# Patient Record
Sex: Male | Born: 1960
Health system: Southern US, Community
[De-identification: ages and names within clinical notes are randomized; demographics above are authoritative.]

## PROBLEM LIST (undated history)

## (undated) DIAGNOSIS — R079 Chest pain, unspecified: Secondary | ICD-10-CM

## (undated) DIAGNOSIS — I499 Cardiac arrhythmia, unspecified: Secondary | ICD-10-CM

## (undated) DIAGNOSIS — Z8249 Family history of ischemic heart disease and other diseases of the circulatory system: Secondary | ICD-10-CM

## (undated) DIAGNOSIS — I10 Essential (primary) hypertension: Secondary | ICD-10-CM

## (undated) DIAGNOSIS — R300 Dysuria: Secondary | ICD-10-CM

## (undated) DIAGNOSIS — Z720 Tobacco use: Secondary | ICD-10-CM

## (undated) DIAGNOSIS — I5043 Acute on chronic combined systolic (congestive) and diastolic (congestive) heart failure: Secondary | ICD-10-CM

## (undated) HISTORY — DX: Family history of ischemic heart disease and other diseases of the circulatory system: Z82.49

## (undated) HISTORY — PX: MANDIBLE FRACTURE SURGERY: SHX706

---
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
## 2016-08-23 ENCOUNTER — Emergency Department (HOSPITAL_COMMUNITY)
Admission: EM | Admit: 2016-08-23 | Discharge: 2016-08-23 | Disposition: A | Discharge status: Home / Self Care | Payer: BLUE CROSS/BLUE SHIELD | Attending: Emergency Medicine | Admitting: Emergency Medicine

## 2016-08-23 DIAGNOSIS — I10 Essential (primary) hypertension: Secondary | ICD-10-CM | POA: Insufficient documentation

## 2016-08-23 DIAGNOSIS — K0889 Other specified disorders of teeth and supporting structures: Secondary | ICD-10-CM | POA: Diagnosis present

## 2016-08-23 LAB — CBC WITH DIFFERENTIAL/PLATELET
Basophils Absolute: 0 10*3/uL (ref 0.0–0.1)
Basophils Relative: 0 %
EOS ABS: 0.1 10*3/uL (ref 0.0–0.7)
EOS PCT: 1 %
HCT: 39.5 % (ref 39.0–52.0)
Hemoglobin: 12.5 g/dL — ABNORMAL LOW (ref 13.0–17.0)
Lymphocytes Relative: 30 %
Lymphs Abs: 2.1 10*3/uL (ref 0.7–4.0)
MCH: 24.8 pg — ABNORMAL LOW (ref 26.0–34.0)
MCHC: 31.6 g/dL (ref 30.0–36.0)
MCV: 78.4 fL (ref 78.0–100.0)
MONO ABS: 0.4 10*3/uL (ref 0.1–1.0)
Monocytes Relative: 6 %
Neutro Abs: 4.5 10*3/uL (ref 1.7–7.7)
Neutrophils Relative %: 63 %
PLATELETS: 204 10*3/uL (ref 150–400)
RBC: 5.04 MIL/uL (ref 4.22–5.81)
RDW: 15.9 % — AB (ref 11.5–15.5)
WBC: 7.2 10*3/uL (ref 4.0–10.5)

## 2016-08-23 LAB — BASIC METABOLIC PANEL
Anion gap: 8 (ref 5–15)
BUN: 19 mg/dL (ref 6–20)
CALCIUM: 9.7 mg/dL (ref 8.9–10.3)
CO2: 25 mmol/L (ref 22–32)
CREATININE: 1.04 mg/dL (ref 0.61–1.24)
Chloride: 107 mmol/L (ref 101–111)
GFR calc Af Amer: >60 mL/min (ref >60)
Glucose, Bld: 91 mg/dL (ref 65–99)
Potassium: 3.6 mmol/L (ref 3.5–5.1)
SODIUM: 140 mmol/L (ref 135–145)

## 2016-08-23 MED ORDER — AMLODIPINE BESYLATE 5 MG PO TABS: 5.0000 mg | ORAL_TABLET | Freq: Every day | ORAL | 0 refills | Status: DC

## 2016-08-23 MED ORDER — AMLODIPINE BESYLATE 5 MG PO TABS
5.0000 mg | ORAL_TABLET | Freq: Once | ORAL | Status: AC
  Administered 2016-08-23: 5 mg via ORAL
  Filled 2016-08-23: qty 1

## 2016-08-23 NOTE — ED Provider Notes (Signed)
[facility] DEPT Provider Note   CSN: [id number] Arrival date & time: 08/23/16  4098     History   Chief Complaint Chief Complaint  Patient presents with  . Dental Pain  . Headache    HPI Jason Pitts is a 56 y.o. male.  The history is provided by the patient and medical records. No language interpreter was used.   Jason Pitts is a 56 y.o. male  with a PMH of HTN who presents to the Emergency Department complaining of right sided facial pain / headache which began last night. He reports that he has a loose, broken upper right tooth causing him a great deal of pain. He endorses intermittent right sided facial "spasm like tics". He took tylenol with little relief. He denies visual changes, chest pain, abdominal pain, shortness of breath, neck pain, fevers/chills, difficulty swallowing or facial swelling. He recently moved to the area and does not have PCP or dentist in the area yet.    No past medical history on file.  There are no active problems to display for this patient.   No past surgical history on file.     Home Medications    Prior to Admission medications   Medication Sig Start Date End Date Taking? Authorizing Provider  amLODipine (NORVASC) 5 MG tablet Take 1 tablet (5 mg total) by mouth daily. 08/23/16   Chase Picket Ondra Deboard, PA-C    Family History No family history on file.  Social History Social History  Substance Use Topics  . Smoking status: Not on file  . Smokeless tobacco: Not on file  . Alcohol use Not on file     Allergies   Patient has no allergy information on record.   Review of Systems Review of Systems  Constitutional: Negative for chills and fever.  HENT: Positive for dental problem. Negative for trouble swallowing.   Eyes: Negative for visual disturbance.  Respiratory: Negative for cough and shortness of breath.   Cardiovascular: Negative.   Gastrointestinal: Negative for abdominal pain, nausea and vomiting.  Genitourinary:  Negative for dysuria.  Musculoskeletal: Negative for back pain and neck pain.  Skin: Negative for rash.  Neurological: Positive for headaches. Negative for dizziness and weakness.     Physical Exam Updated Vital Signs BP (!) 176/127 (BP Location: Right Arm)   Pulse 68   Temp 97.7 F (36.5 C) (Oral)   Resp 18   Ht 5\' 3"  (1.6 m)   Wt 78.6 kg   SpO2 99%   BMI 30.69 kg/m   Physical Exam  Constitutional: He is oriented to person, place, and time. He appears well-developed and well-nourished. No distress.  HENT:  Head: Normocephalic and atraumatic.  Mouth/Throat:    Midline uvula, no trismus, oropharynx moist and clear, no oropharyngeal erythema or edema, neck supple and no tenderness. No facial edema  Eyes: EOM are normal. Pupils are equal, round, and reactive to light.  Neck:  No midline or paraspinal tenderness. Full range of motion without pain.  Cardiovascular: Normal rate, regular rhythm and normal heart sounds.   No murmur heard. Pulmonary/Chest: Effort normal and breath sounds normal. No respiratory distress. He has no wheezes. He has no rales. He exhibits no tenderness.  Abdominal: Soft. He exhibits no distension. There is no tenderness.  Musculoskeletal: He exhibits no edema.  Neurological: He is alert and oriented to person, place, and time.  Skin: Skin is warm and dry.  Nursing note and vitals reviewed.    ED Treatments / Results  Labs (all labs ordered are listed, but only abnormal results are displayed) Labs Reviewed  CBC WITH DIFFERENTIAL/PLATELET - Abnormal; Notable for the following:       Result Value   Hemoglobin 12.5 (*)    MCH 24.8 (*)    RDW 15.9 (*)    All other components within normal limits  BASIC METABOLIC PANEL    EKG  EKG Interpretation None       Radiology No results found.  Procedures Procedures (including critical care time)  Medications Ordered in ED Medications  amLODipine (NORVASC) tablet 5 mg (5 mg Oral Given 08/23/16  1054)     Initial Impression / Assessment and Plan / ED Course  I have reviewed the triage vital signs and the nursing notes.  Pertinent labs & imaging results that were available during my care of the patient were reviewed by me and considered in my medical decision making (see chart for details).  Clinical Course    [name] is a 56 y.o. male who presents to ED for dental pain, headache and elevated blood pressure.   1. Dental pain 2/2 loose tooth. Does not appear infected. New to the area and no dentist here but plans on following up and has dentist in mind. Ibuprofen PRN pain.   2. Elevated BP of 172/122. Hx of HTN but has not been on medication in over two years. Labs reassuring. No chest pain, shortness of breath, abdominal pain or visual changes. 5mg  norvasc given. Patient re-evaluated and states that headache is much improved. Will give rx for norvasc. Patient understands this is a one month prescription and he will need to see PCP for follow up. Resources for PCP in the area given.   Return precautions and follow up care discussed again prior to discharge. All questions answered.    Final Clinical Impressions(s) / ED Diagnoses   Final diagnoses:  Pain, dental  Essential hypertension    New Prescriptions New Prescriptions   AMLODIPINE (NORVASC) 5 MG TABLET    Take 1 tablet (5 mg total) by mouth daily.     [facility] Delois Tolbert, PA-C 08/23/16 1240    Marily Memos, MD 08/24/16 [phone]

## 2016-08-23 NOTE — ED Triage Notes (Signed)
Pt. Is having rt. Facial pain into his rt. Head.  Pt. Worked last night and got off work and brought his wife here for chest pain.  He developed rt. Facial pain and head pain yesterday.  He is having spasms into his rt. Face area.  He does have broken rt.upper teeth.   Prt. Is alert and oriented /x4. Skin is warm and dry.  Pt. Does have  htn but took himself off the medications.

## 2016-08-23 NOTE — Discharge Instructions (Signed)
It was my pleasure taking care of you today!  I have given you a one month supply of blood pressure medication. Please take this daily starting in the morning. You will need to follow up with a primary care provider for blood pressure recheck and further refills of your medication.  Please see a dentist for evaluation of dental pain. Ibuprofen as needed for pain.  Return to ER for new or worsening symptoms, any additional concerns.

## 2016-08-23 NOTE — ED Notes (Signed)
Placed patient into a gown and on the monitor patient is resting waiting on provider 

## 2016-08-23 NOTE — ED Notes (Signed)
Pt states he has been  Under considerable stress-- thinks that is why his head hurts.  States "I used to take BP meds"

## 2016-10-17 ENCOUNTER — Ambulatory Visit: Payer: BLUE CROSS/BLUE SHIELD | Admitting: Family Medicine

## 2017-02-15 ENCOUNTER — Encounter: Payer: Self-pay | Admitting: Family Medicine

## 2017-02-15 ENCOUNTER — Ambulatory Visit (INDEPENDENT_AMBULATORY_CARE_PROVIDER_SITE_OTHER): Payer: BLUE CROSS/BLUE SHIELD | Admitting: Family Medicine

## 2017-02-15 VITALS — BP 160/118 | HR 66 | Temp 98.4°F | Ht 63.0 in | Wt 172.6 lb

## 2017-02-15 DIAGNOSIS — Z1159 Encounter for screening for other viral diseases: Secondary | ICD-10-CM

## 2017-02-15 DIAGNOSIS — Z720 Tobacco use: Secondary | ICD-10-CM | POA: Diagnosis not present

## 2017-02-15 DIAGNOSIS — Z8679 Personal history of other diseases of the circulatory system: Secondary | ICD-10-CM | POA: Insufficient documentation

## 2017-02-15 DIAGNOSIS — Z23 Encounter for immunization: Secondary | ICD-10-CM | POA: Diagnosis not present

## 2017-02-15 DIAGNOSIS — I10 Essential (primary) hypertension: Secondary | ICD-10-CM | POA: Diagnosis not present

## 2017-02-15 MED ORDER — NICOTINE 14 MG/24HR TD PT24: 14.0000 mg | MEDICATED_PATCH | Freq: Every day | TRANSDERMAL | 0 refills | Status: DC

## 2017-02-15 MED ORDER — AMLODIPINE BESYLATE 5 MG PO TABS: 10.0000 mg | ORAL_TABLET | Freq: Every day | ORAL | 0 refills | Status: DC

## 2017-02-15 MED ORDER — NICOTINE 7 MG/24HR TD PT24: 14.0000 mg | MEDICATED_PATCH | Freq: Every day | TRANSDERMAL | Status: DC

## 2017-02-15 NOTE — Assessment & Plan Note (Signed)
Patient reports that he has smoked since he was 56 years old, now one pack of cigarettes lasts him 3 days. He initially is pre-contemplative, and then later suggests he would like to try patches. Counseled on the quit line as well. We'll continue to follow with counseling for this. Additionally, his wife smokes.

## 2017-02-15 NOTE — Patient Instructions (Signed)
It was a pleasure to see you today! Thank you for choosing [facility] for your primary care. Jason Pitts was seen for new patient, high blood pressure.   Our plans for today were:  Start the blood pressure medicine that I gave you. If you feel light headed, you can decrease the dose by half.   Try the nicotine patches. It is most helpful to use these if your wife is also trying to quit. You can also use the [place] Quitline for free to get a coach to help you quit. Every quit attempt gets you closer to quitting for good!   Telephone Service is available 24/7 toll-free at  1-800-QUIT-NOW 772-685-8862).   To keep you healthy, we need to monitor some screening tests. You are due for your colonoscopy. Please call to schedule this with the paper we gave you.   You should return to our clinic to see a nurse visit in 1 week for recheck blood pressure. You should see Dr. Chanetta Marshall in 3 months to recheck your blood pressure.   Best,  Dr. Chanetta Marshall

## 2017-02-15 NOTE — Progress Notes (Signed)
   CC: new patient   HPI  Referred by: ED for no PCP, wife sees Dr. 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 No other PMH.   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 Precontemplation. Wants patches. Quit 1 time in the past x 1 week.  Alcohol use: 1 beer this morning, on the weekends, no hx of alcoholism  Drug use: used THC in the past, never IV drugs   CC, SH/smoking status, and VS noted  Objective: BP (!) 160/118   Pulse 66   Temp 98.4 F (36.9 C) (Oral)   Ht 5\' 3"  (1.6 m)   Wt 172 lb 9.6 oz (78.3 kg)   SpO2 97%   BMI 30.57 kg/m  Gen: NAD, alert, cooperative, and pleasant. HEENT: NCAT, EOMI, PERRL CV: RRR, no murmur Resp: CTAB, no wheezes, non-labored Abd: SNTND, BS present, no guarding or organomegaly Ext: No edema, warm Neuro: Alert and oriented, Speech clear, No gross deficits  Assessment and plan:  HTN (hypertension) Chronic. He reports that this runs in his family extensively. History of being on clonidine for this. Does not recall any extensive workup for secondary causes. Started on Norvasc by the ED in January.He reports that he tolerated this well. Will represcribed Norvasc and increase to 10 mg per day. Patient counseled on return precautions, as well as decreasing to 5 mg per day should he feel lightheaded. He reports that he does not check his blood pressure at home, but only he will try to do this. Return to clinic in 1 week for nurse visit to recheck blood pressure, and in 3 months to see me. Will consider workup for secondary causes should this be resistant hypertension.   Tobacco abuse Patient  reports that he has smoked since he was 56 years old, now one pack of cigarettes lasts him 3 days. He initially is pre-contemplative, and then later suggests he would like to try patches. Counseled on the quit line as well. We'll continue to follow with counseling for this. Additionally, his wife smokes.   Orders Placed This Encounter  Procedures  . CBC  . Basic Metabolic Panel  . HIV antibody  . Hepatitis C antibody    Meds ordered this encounter  Medications  . amLODipine (NORVASC) 5 MG tablet    Sig: Take 2 tablets (10 mg total) by mouth daily.    Dispense:  60 tablet    Refill:  0  . DISCONTD: nicotine (NICODERM CQ - dosed in mg/24 hr) patch 14 mg  . nicotine (NICODERM CQ - DOSED IN MG/24 HOURS) 14 mg/24hr patch    Sig: Place 1 patch (14 mg total) onto the skin daily.    Dispense:  28 patch    Refill:  0    Health Maintenance: due for colonoscopy, Given handout for this. Ordered screening HIV and hep C with labs.  Loni Muse, MD, PGY1 02/15/2017 4:45 PM

## 2017-02-15 NOTE — Assessment & Plan Note (Signed)
Chronic. He reports that this runs in his family extensively. History of being on clonidine for this. Does not recall any extensive workup for secondary causes. Started on Norvasc by the ED in January.He reports that he tolerated this well. Will represcribed Norvasc and increase to 10 mg per day. Patient counseled on return precautions, as well as decreasing to 5 mg per day should he feel lightheaded. He reports that he does not check his blood pressure at home, but only he will try to do this. Return to clinic in 1 week for nurse visit to recheck blood pressure, and in 3 months to see me. Will consider workup for secondary causes should this be resistant hypertension.

## 2017-02-18 ENCOUNTER — Other Ambulatory Visit: Payer: Self-pay | Admitting: *Deleted

## 2017-02-18 MED ORDER — AMLODIPINE BESYLATE 5 MG PO TABS: 10.0000 mg | ORAL_TABLET | Freq: Every day | ORAL | 0 refills | Status: DC

## 2017-02-18 NOTE — Addendum Note (Signed)
Addended by: Lamonte Sakai, Jefrey Raburn D on: 02/18/2017 09:54 AM   Modules accepted: Orders, SmartSet

## 2017-02-22 ENCOUNTER — Other Ambulatory Visit: Payer: BLUE CROSS/BLUE SHIELD

## 2017-03-04 ENCOUNTER — Observation Stay (HOSPITAL_BASED_OUTPATIENT_CLINIC_OR_DEPARTMENT_OTHER): Payer: BLUE CROSS/BLUE SHIELD

## 2017-03-04 ENCOUNTER — Emergency Department (HOSPITAL_COMMUNITY): Payer: BLUE CROSS/BLUE SHIELD

## 2017-03-04 ENCOUNTER — Observation Stay (HOSPITAL_COMMUNITY)
Admission: EM | Admit: 2017-03-04 | Discharge: 2017-03-05 | Disposition: A | Discharge status: Home / Self Care | Payer: BLUE CROSS/BLUE SHIELD | Attending: Family Medicine | Admitting: Family Medicine

## 2017-03-04 ENCOUNTER — Encounter (HOSPITAL_COMMUNITY): Payer: Self-pay | Admitting: *Deleted

## 2017-03-04 DIAGNOSIS — I159 Secondary hypertension, unspecified: Secondary | ICD-10-CM

## 2017-03-04 DIAGNOSIS — F17200 Nicotine dependence, unspecified, uncomplicated: Secondary | ICD-10-CM | POA: Insufficient documentation

## 2017-03-04 DIAGNOSIS — Z8249 Family history of ischemic heart disease and other diseases of the circulatory system: Secondary | ICD-10-CM

## 2017-03-04 DIAGNOSIS — Z79899 Other long term (current) drug therapy: Secondary | ICD-10-CM | POA: Insufficient documentation

## 2017-03-04 DIAGNOSIS — Z720 Tobacco use: Secondary | ICD-10-CM | POA: Diagnosis not present

## 2017-03-04 DIAGNOSIS — R079 Chest pain, unspecified: Secondary | ICD-10-CM | POA: Diagnosis not present

## 2017-03-04 DIAGNOSIS — I10 Essential (primary) hypertension: Secondary | ICD-10-CM | POA: Insufficient documentation

## 2017-03-04 DIAGNOSIS — Z8679 Personal history of other diseases of the circulatory system: Secondary | ICD-10-CM | POA: Diagnosis present

## 2017-03-04 DIAGNOSIS — I420 Dilated cardiomyopathy: Secondary | ICD-10-CM | POA: Diagnosis not present

## 2017-03-04 DIAGNOSIS — I428 Other cardiomyopathies: Secondary | ICD-10-CM

## 2017-03-04 HISTORY — DX: Essential (primary) hypertension: I10

## 2017-03-04 HISTORY — DX: Tobacco use: Z72.0

## 2017-03-04 HISTORY — DX: Chest pain, unspecified: R07.9

## 2017-03-04 LAB — BASIC METABOLIC PANEL
ANION GAP: 9 (ref 5–15)
BUN: 24 mg/dL — ABNORMAL HIGH (ref 6–20)
CO2: 22 mmol/L (ref 22–32)
Calcium: 8.9 mg/dL (ref 8.9–10.3)
Chloride: 106 mmol/L (ref 101–111)
Creatinine, Ser: 1.27 mg/dL — ABNORMAL HIGH (ref 0.61–1.24)
GLUCOSE: 108 mg/dL — AB (ref 65–99)
POTASSIUM: 3.6 mmol/L (ref 3.5–5.1)
Sodium: 137 mmol/L (ref 135–145)

## 2017-03-04 LAB — CBC
HEMATOCRIT: 41.1 % (ref 39.0–52.0)
HEMOGLOBIN: 13.2 g/dL (ref 13.0–17.0)
MCH: 24.6 pg — ABNORMAL LOW (ref 26.0–34.0)
MCHC: 32.1 g/dL (ref 30.0–36.0)
MCV: 76.7 fL — ABNORMAL LOW (ref 78.0–100.0)
Platelets: 217 10*3/uL (ref 150–400)
RBC: 5.36 MIL/uL (ref 4.22–5.81)
RDW: 15 % (ref 11.5–15.5)
WBC: 8.4 10*3/uL (ref 4.0–10.5)

## 2017-03-04 LAB — TROPONIN I
TROPONIN I: 0.04 ng/mL — AB (ref <0.03)
TROPONIN I: 0.04 ng/mL — AB (ref <0.03)
TROPONIN I: 0.03 ng/mL — AB (ref <0.03)

## 2017-03-04 LAB — I-STAT TROPONIN, ED: TROPONIN I, POC: 0.03 ng/mL (ref 0.00–0.08)

## 2017-03-04 LAB — LIPID PANEL
Cholesterol: 160 mg/dL (ref 0–200)
HDL: 44 mg/dL (ref >40)
LDL Cholesterol: 99 mg/dL (ref 0–99)
Total CHOL/HDL Ratio: 3.6 RATIO
Triglycerides: 85 mg/dL (ref <150)
VLDL: 17 mg/dL (ref 0–40)

## 2017-03-04 LAB — NM MYOCAR MULTI W/SPECT W/WALL MOTION / EF
CHL CUP RESTING HR STRESS: 58 {beats}/min
CSEPED: 5 min
CSEPEDS: 16 s
CSEPPHR: 91 {beats}/min

## 2017-03-04 MED ORDER — AMLODIPINE BESYLATE 10 MG PO TABS
10.0000 mg | ORAL_TABLET | Freq: Every day | ORAL | Status: DC
  Administered 2017-03-04 – 2017-03-05 (×2): 10 mg via ORAL
  Filled 2017-03-04 (×3): qty 1

## 2017-03-04 MED ORDER — MORPHINE SULFATE (PF) 2 MG/ML IV SOLN: 2.0000 mg | INTRAVENOUS | Status: DC | PRN

## 2017-03-04 MED ORDER — GI COCKTAIL ~~LOC~~: 30.0000 mL | Freq: Four times a day (QID) | ORAL | Status: DC | PRN

## 2017-03-04 MED ORDER — MORPHINE SULFATE (PF) 4 MG/ML IV SOLN
4.0000 mg | Freq: Once | INTRAVENOUS | Status: AC
  Administered 2017-03-04: 4 mg via INTRAVENOUS
  Filled 2017-03-04: qty 1

## 2017-03-04 MED ORDER — HYDRALAZINE HCL 20 MG/ML IJ SOLN
5.0000 mg | Freq: Four times a day (QID) | INTRAMUSCULAR | Status: DC | PRN
  Administered 2017-03-05: 5 mg via INTRAVENOUS
  Filled 2017-03-04: qty 1

## 2017-03-04 MED ORDER — REGADENOSON 0.4 MG/5ML IV SOLN
0.4000 mg | Freq: Once | INTRAVENOUS | Status: AC
  Administered 2017-03-04: 0.4 mg via INTRAVENOUS

## 2017-03-04 MED ORDER — SODIUM CHLORIDE 0.9 % IV SOLN
INTRAVENOUS | Status: DC
  Administered 2017-03-04: 100 mL/h via INTRAVENOUS
  Administered 2017-03-05 (×2): via INTRAVENOUS

## 2017-03-04 MED ORDER — REGADENOSON 0.4 MG/5ML IV SOLN
INTRAVENOUS | Status: AC
  Filled 2017-03-04: qty 5

## 2017-03-04 MED ORDER — NITROGLYCERIN 2 % TD OINT
1.0000 [in_us] | TOPICAL_OINTMENT | Freq: Once | TRANSDERMAL | Status: AC
  Administered 2017-03-04: 1 [in_us] via TOPICAL
  Filled 2017-03-04: qty 1

## 2017-03-04 MED ORDER — ACETAMINOPHEN 325 MG PO TABS
650.0000 mg | ORAL_TABLET | ORAL | Status: DC | PRN
  Administered 2017-03-04 – 2017-03-05 (×4): 650 mg via ORAL
  Filled 2017-03-04 (×4): qty 2

## 2017-03-04 MED ORDER — ONDANSETRON HCL 4 MG/2ML IJ SOLN: 4.0000 mg | Freq: Four times a day (QID) | INTRAMUSCULAR | Status: DC | PRN

## 2017-03-04 MED ORDER — NICOTINE 14 MG/24HR TD PT24
14.0000 mg | MEDICATED_PATCH | Freq: Every day | TRANSDERMAL | Status: DC
  Administered 2017-03-04 – 2017-03-05 (×2): 14 mg via TRANSDERMAL
  Filled 2017-03-04 (×2): qty 1

## 2017-03-04 MED ORDER — ONDANSETRON HCL 4 MG/2ML IJ SOLN
4.0000 mg | Freq: Once | INTRAMUSCULAR | Status: AC
  Administered 2017-03-04: 4 mg via INTRAVENOUS
  Filled 2017-03-04: qty 2

## 2017-03-04 MED ORDER — TECHNETIUM TC 99M TETROFOSMIN IV KIT
10.0000 | PACK | Freq: Once | INTRAVENOUS | Status: AC | PRN
  Administered 2017-03-04: 10 via INTRAVENOUS

## 2017-03-04 MED ORDER — TECHNETIUM TC 99M TETROFOSMIN IV KIT
30.0000 | PACK | Freq: Once | INTRAVENOUS | Status: AC | PRN
  Administered 2017-03-04: 30 via INTRAVENOUS

## 2017-03-04 MED ORDER — ENOXAPARIN SODIUM 40 MG/0.4ML ~~LOC~~ SOLN
40.0000 mg | SUBCUTANEOUS | Status: DC
  Administered 2017-03-04 – 2017-03-05 (×2): 40 mg via SUBCUTANEOUS
  Filled 2017-03-04 (×2): qty 0.4

## 2017-03-04 MED ORDER — ASPIRIN EC 325 MG PO TBEC
325.0000 mg | DELAYED_RELEASE_TABLET | Freq: Every day | ORAL | Status: DC
  Administered 2017-03-04 – 2017-03-05 (×2): 325 mg via ORAL
  Filled 2017-03-04 (×2): qty 1

## 2017-03-04 NOTE — Progress Notes (Signed)
Interim Progress Note  Saw Jason Pitts this morning.  He says he feels well except for a right sided, throbbing headache that he attributes to nitroglycerin.  On exam, he is still very tender to palpation of his left chest wall around T5-T8.  No other abnormalities  Cardiology saw him this morning and are trending his troponins.  If negative, they will proceed with a lexiscan myoview.  Troponins 0.3 --> 0.4 so far.  Lipid panel and A1C ordered.

## 2017-03-04 NOTE — Progress Notes (Signed)
CRITICAL VALUE ALERT  Critical Value: troponin 0.04  Date & Time Notied:  03/04/17 0930  Provider Notified: Lezlie Octave  Orders Received/Actions taken: Dr. Frances Furbish is aware and will continue to monitor.

## 2017-03-04 NOTE — ED Provider Notes (Signed)
[facility] Provider Note   CSN: [id number] Arrival date & time: 03/04/17  0325     History   Chief Complaint Chief Complaint  Patient presents with  . Chest Pain    HPI   Blood pressure (!) 144/107, pulse 65, resp. rate 16, SpO2 95 %.  Jacarri Gesner is a 56 y.o. male with past medical history significant for hypertension and tobacco use complaining of a left-sided chest pain, nonradiating described as sharp onset this evening while he was working, this is nonexertional. It is associated with diaphoresis, shortness of breath and feeling lightheaded. There was no syncope. He states that the pain has eased off somewhat, it is 7 out of 10 right now. He was given full dose aspirin by EMS. He denies any increasing peripheral edema, pulsations, Pain, leg swelling. He has a family history of ACS  Past Medical History:  Diagnosis Date  . HTN (hypertension)     Patient Active Problem List   Diagnosis Date Noted  . HTN (hypertension) 02/15/2017  . Tobacco abuse 02/15/2017    Past Surgical History:  Procedure Laterality Date  . MANDIBLE FRACTURE SURGERY         Home Medications    Prior to Admission medications   Medication Sig Start Date End Date Taking? Authorizing Provider  amLODipine (NORVASC) 5 MG tablet Take 2 tablets (10 mg total) by mouth daily. 02/18/17  Yes Garth Bigness, MD  nicotine (NICODERM CQ - DOSED IN MG/24 HOURS) 14 mg/24hr patch Place 1 patch (14 mg total) onto the skin daily. 02/15/17   Garth Bigness, MD    Family History History reviewed. No pertinent family history.  Social History Social History  Substance Use Topics  . Smoking status: Current Some Day Smoker  . Smokeless tobacco: Never Used  . Alcohol use Yes     Allergies   Patient has no known allergies.   Review of Systems Review of Systems  A complete review of systems was obtained and all systems are negative except as noted in the HPI and PMH.    Physical  Exam Updated Vital Signs BP (!) 144/107   Pulse 65   Temp (!) 97.3 F (36.3 C) (Oral)   Resp 16   SpO2 95%   Physical Exam  Constitutional: He is oriented to person, place, and time. He appears well-developed and well-nourished. No distress.  HENT:  Head: Normocephalic and atraumatic.  Mouth/Throat: Oropharynx is clear and moist.  Eyes: Pupils are equal, round, and reactive to light. Conjunctivae and EOM are normal.  Neck: Normal range of motion. No JVD present. No tracheal deviation present.  Cardiovascular: Normal rate, regular rhythm and intact distal pulses.   Radial pulse equal bilaterally  Pulmonary/Chest: Effort normal and breath sounds normal. No stridor. No respiratory distress. He has no wheezes. He has no rales. He exhibits no tenderness.  Abdominal: Soft. He exhibits no distension and no mass. There is no tenderness. There is no rebound and no guarding.  Musculoskeletal: Normal range of motion. He exhibits no edema or tenderness.  No calf asymmetry, superficial collaterals, palpable cords, edema, Homans sign negative bilaterally.    Neurological: He is alert and oriented to person, place, and time.  Skin: Skin is warm. He is not diaphoretic.  Psychiatric: He has a normal mood and affect.  Nursing note and vitals reviewed.    ED Treatments / Results  Labs (all labs ordered are listed, but only abnormal results are displayed) Labs Reviewed  BASIC METABOLIC PANEL -  Abnormal; Notable for the following:       Result Value   Glucose, Bld 108 (*)    BUN 24 (*)    Creatinine, Ser 1.27 (*)    All other components within normal limits  CBC - Abnormal; Notable for the following:    MCV 76.7 (*)    MCH 24.6 (*)    All other components within normal limits  I-STAT TROPOININ, ED    EKG  EKG Interpretation  Date/Time:  Monday March 04 2017 03:41:35 EDT Ventricular Rate:  67 PR Interval:    QRS Duration: 117 QT Interval:  443 QTC Calculation: 468 R Axis:   41 Text  Interpretation:  Sinus rhythm Borderline prolonged PR interval Probable left atrial enlargement Incomplete right bundle branch block Left ventricular hypertrophy Anterior infarct, old Abnormal T, consider ischemia, lateral leads No old tracing to compare Confirmed by Dione Booze ([id number]) on 03/04/2017 3:50:04 AM       Radiology Dg Chest 2 View  Result Date: 03/04/2017 CLINICAL DATA:  56 year old male with chest pain EXAM: CHEST  2 VIEW COMPARISON:  None. FINDINGS: The lungs are clear. There is no pleural effusion or pneumothorax. There is mild cardiomegaly. No acute osseous pathology. IMPRESSION: No active cardiopulmonary disease. Electronically Signed   By: Elgie Collard M.D.   On: 03/04/2017 04:18    Procedures Procedures (including critical care time)  Medications Ordered in ED Medications  nitroGLYCERIN (NITROGLYN) 2 % ointment 1 inch (not administered)  morphine 4 MG/ML injection 4 mg (4 mg Intravenous Given 03/04/17 0404)  ondansetron (ZOFRAN) injection 4 mg (4 mg Intravenous Given 03/04/17 0404)     Initial Impression / Assessment and Plan / ED Course  I have reviewed the triage vital signs and the nursing notes.  Pertinent labs & imaging results that were available during my care of the patient were reviewed by me and considered in my medical decision making (see chart for details).     Vitals:   03/04/17 0331 03/04/17 0355  BP: (!) 144/107   Pulse: 65   Resp: 16   Temp:  (!) 97.3 F (36.3 C)  TempSrc:  Oral  SpO2: 95%     Medications  nitroGLYCERIN (NITROGLYN) 2 % ointment 1 inch (not administered)  morphine 4 MG/ML injection 4 mg (4 mg Intravenous Given 03/04/17 0404)  ondansetron (ZOFRAN) injection 4 mg (4 mg Intravenous Given 03/04/17 0404)    Lenardo Guntrum is 56 y.o. male presenting with Acute onset of left-sided chest pain, nonradiating associated with nausea, diaphoresis and feeling lightheaded. Pain is severe. He has multiple cardiac risk factors. EKG  concerning for inverted T waves in the inferior leads, there is ST depression in the lateral leads as well as inverted T waves. We do not have a old EKG to compare to. Patient had full dose aspirin, some improvement with morphine, will give nitroglycerin but he will need admission for chest pain rule out. Discussed with family practice Dr. Frances Furbish who accepts admission    Final Clinical Impressions(s) / ED Diagnoses   Final diagnoses:  Chest pain, unspecified type    New Prescriptions New Prescriptions   No medications on file     Kaylyn Lim 03/04/17 0451    Dione Booze, MD 03/04/17 [phone]

## 2017-03-04 NOTE — ED Notes (Signed)
Family Practice at bedside.

## 2017-03-04 NOTE — Progress Notes (Signed)
    Patient presented for Lexiscan nuclear stress test. Tolerated procedure well. Pending final stress imaging result.  Berton Bon, AGNP-C 03/04/2017  1:43 PM Pager: [phone]

## 2017-03-04 NOTE — Consult Note (Signed)
Cardiology Consultation:   Patient ID: Iwao Shamblin; [id number]; Nov 13, 1960   Admit date: 03/04/2017 Date of Consult: 03/04/2017  Primary Care Provider: Garth Bigness, MD Primary Cardiologist: New to [facility] - Dr. Rennis Golden   Patient Profile:   Jason Pitts is a 56 y.o. male with past medical history of HTN and tobacco use who is being seen today for the evaluation of chest pain at the request of Dr. Pollie Meyer.  History of Present Illness:   Mr. Cui presented to [facility] ED on 03/04/2017 for evaluation of chest pain. He reports developing abdominal pain while at work yesterday evening which radiated into his chest. 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 Reports he was very diaphoretic and had some nausea at that time. He initially thought this was due to "gas", so he went to the restroom and passed flatulence which initially helped with his symptoms. He then developed a stabbing pain along his apical region and therefore had his coworkers call EMS.  He reports still having a mild pain at this time, but improved since admission. Pain improved with administration of IV Morphine and NTG patch.   He does report being under increased stress over the past several months as he just recently moved from [place], [place] to [place] and his father is currently in the hospital in [place].  He reports having a heart attack in 1996 but a cardiac catheterization was performed at that time and "normal" according to the patient. Has not been followed by Cardiology since. Does report a history of hypertension but denies any known hyperlipidemia, type 2 diabetes, or kidney issues. Reports his father was diagnosed with CAD in his 49's and has known CHF. The patient does have a 12-pack-year history (having smoked 0.3 ppd for 40 years). He reports occasional alcohol use. Denies any recreational drug use.   Initial labs show WBC of 8.4, Hgb 13.2, platelets 217.  Na+ 137, K+ 3.6, creatinine 1.27 (at 1.04 in 08/2016). Initial troponin negative with repeat values pending. CXR shows no active cardiopulmonary disease. EKG with NSR, HR 67, incomplete RBBB, and LVH with diffuse TWI.     Past Medical History:  Diagnosis Date  . HTN (hypertension)   . Tobacco use     Past Surgical History:  Procedure Laterality Date  . MANDIBLE FRACTURE SURGERY       Inpatient Medications: Scheduled Meds: . amLODipine  10 mg Oral Daily  . aspirin EC  325 mg Oral Daily  . enoxaparin (LOVENOX) injection  40 mg Subcutaneous Q24H  . nicotine  14 mg Transdermal Daily   Continuous Infusions: . sodium chloride 100 mL/hr (03/04/17 0642)   PRN Meds: acetaminophen, gi cocktail, hydrALAZINE, morphine injection, ondansetron (ZOFRAN) IV  Allergies:   No Known Allergies  Social History:   Social History   Social History  . Marital status: Married    Spouse name: N/A  . Number of children: N/A  . Years of education: N/A   Occupational History  . Not on file.   Social History Main Topics  . Smoking status: Current Some Day Smoker    Packs/day: 0.30    Years: 40.00  . Smokeless tobacco: Never Used  . Alcohol use Yes  . Drug use: No  . Sexual activity: Not on file   Other Topics Concern  . Not on file   Social History Narrative  . No narrative on file    Family History:   The patient's  family history includes CAD in his father; Heart failure in his father.  ROS:  Please see the history of present illness.  Review of Systems  Constitution: Positive for weakness. Negative for chills, decreased appetite and fever.  Cardiovascular: Positive for chest pain. Negative for dyspnea on exertion, irregular heartbeat, leg swelling, orthopnea and palpitations.  Respiratory: Negative for shortness of breath.   Musculoskeletal: Positive for arthritis and myalgias.  Gastrointestinal: Positive for bloating and nausea. Negative for hematochezia, melena and vomiting.    Genitourinary: Negative for hematuria.  Neurological: Negative for light-headedness and loss of balance.    All other ROS reviewed and negative.     Physical Exam/Data:   Vitals:   03/04/17 0355 03/04/17 0500 03/04/17 0515 03/04/17 0600  BP:  (!) 144/111 (!) 156/115 (!) 149/99  Pulse:  (!) 58 65 (!) 57  Resp:  13 14   Temp: (!) 97.3 F (36.3 C)   97.6 F (36.4 C)  TempSrc: Oral   Oral  SpO2:  98% 99% 97%  Weight:    171 lb 12.8 oz (77.9 kg)  Height:    5\' 3"  (1.6 m)    Intake/Output Summary (Last 24 hours) at 03/04/17 0719 Last data filed at 03/04/17 0654  Gross per 24 hour  Intake               20 ml  Output                0 ml  Net               20 ml   Filed Weights   03/04/17 0600  Weight: 171 lb 12.8 oz (77.9 kg)   Body mass index is 30.43 kg/m.  General:  Well nourished, well developed African American male appearing in no acute distress. HEENT: normal Lymph: no adenopathy Neck: no JVD Endocrine:  No thryomegaly Vascular: No carotid bruits; FA pulses 2+ bilaterally without bruits  Cardiac:  normal S1, S2; RRR; no murmur. Tender to palpation along apical region. Lungs:  clear to auscultation bilaterally, no wheezing, rhonchi or rales  Abd: soft, nontender, no hepatomegaly  Ext: no edema Musculoskeletal:  No deformities, BUE and BLE strength normal and equal Skin: warm and dry  Neuro:  CNs 2-12 intact, no focal abnormalities noted Psych:  Normal affect   EKG:  The EKG was personally reviewed and demonstrates:  NSR, HR 67, incomplete RBBB, and LVH with diffuse TWI.    Relevant CV Studies:  None on File  Laboratory Data:  Chemistry  Recent Labs Lab 03/04/17 0340  NA 137  K 3.6  CL 106  CO2 22  GLUCOSE 108*  BUN 24*  CREATININE 1.27*  CALCIUM 8.9  GFRNONAA >60  GFRAA >60  ANIONGAP 9    No results for input(s): PROT, ALBUMIN, AST, ALT, ALKPHOS, BILITOT in the last 168 hours. Hematology  Recent Labs Lab 03/04/17 0340  WBC 8.4  RBC  5.36  HGB 13.2  HCT 41.1  MCV 76.7*  MCH 24.6*  MCHC 32.1  RDW 15.0  PLT 217   Cardiac EnzymesNo results for input(s): TROPONINI in the last 168 hours.   Recent Labs Lab 03/04/17 0345  TROPIPOC 0.03    BNPNo results for input(s): BNP, PROBNP in the last 168 hours.  DDimer No results for input(s): DDIMER in the last 168 hours.  Radiology/Studies:  Dg Chest 2 View  Result Date: 03/04/2017 CLINICAL DATA:  56 year old male with chest pain EXAM: CHEST  2  VIEW COMPARISON:  None. FINDINGS: The lungs are clear. There is no pleural effusion or pneumothorax. There is mild cardiomegaly. No acute osseous pathology. IMPRESSION: No active cardiopulmonary disease. Electronically Signed   By: Elgie Collard M.D.   On: 03/04/2017 04:18    Assessment and Plan:   1. Atypical Chest Pain  - presented to [facility] ED on 7/16 for evaluation of abdominal/chest discomfort which started while pushing shopping carts at 2000 on 7/15. Notes associated nausea and diaphoresis. Pain improved with flatulence. Denies any exertional chest pain or dyspnea prior to yesterday's episode.  - He does have cardiac risk factors including HTN, tobacco use, and family history of CAD. However, on examination his pain is reproducible on palpation along the apical region.  - Initial troponin is negative with repeat values pending. EKG shows NSR, HR 67, incomplete RBBB, and LVH with diffuse TWI.  - continue to trend cardiac enzymes. If repeat troponin values remains negative, would favor noninvasive evaluation with a NST in the setting of his atypical symptoms. If enzymes trend upwards, would need definitive evaluation with a cardiac catheterization.   2. HTN - BP at 149/99 this AM.  - continue to follow. Remains on PTA Amlodipine 10mg  daily.   3. Tobacco Use - smokes 0.3 ppd.  - full cessation advised.    Signed, Ellsworth Lennox, PA-C  03/04/2017 7:19 AM

## 2017-03-04 NOTE — H&P (Signed)
Family Medicine Teaching [facility] Admission History and Physical Service Pager: [phone]  Patient name: Uziel Covault Medical record number: [id number] Date of birth: 08-Aug-1961 Age: 56 y.o. Gender: male  Primary Care Provider: Garth Bigness, MD Consultants: none Code Status: full  Chief Complaint: chest pain  Assessment and Plan: Rushi Chasen is a 56 y.o. male presenting with chest pain. PMH is significant for HTN and tobacco abuse.    Chest pain: DDx includes MI, stable angina, GERD, costochondritis, PE.  Chest pain started at around 8:30pm on 03/03/17 and was accompanied by gas pains.  Described as "sharp," non-radiating, patient not sure if it is related to exertion or not.  Also endorses throbbing frontal headache, shortness of breath, nausea without emesis, and diaphoresis.  He took 4 ASA for this, which helped to relieve his symptoms.  Nitroglycerin patch given in ED also helped alleviate his pain, and he is asymptomatic except for chest soreness at this time.  EKG concerning for T wave inversion in lateral leads, incomplete right bundle branch block, left atrial enlargement, no EKG available for comparison.  CXR wnl.  POC Troponin negative x 1.  GI source of pain likely given location and sharp nature of pain; however patient has many risk factors for ACS, so this must be ruled out.  Heart score 4. Denies leg swelling and leg pain, shortness of breath has resolved, so clinical likelihood of PE is low. -admit to telemetry under attending Dr. Pollie Meyer -trend serum troponin -cardiology consulted, appreciate recommendations -daily ASA  -2mg  morphine q2 PRN -repeat EKG at 1000 -GI cocktail PRN -continuous pulse ox -vitals per unit routine  HTN: BP has been 140s-150s/110's since admission, was 160/118 at clinic visit on 02/15/17.  His Norvasc was increased from 5 mg to 10 mg by PCP at that time, is compliant with this and reports that he took it on 03/03/17 am.  -continue  home Norvasc 10 mg daily -hydralazine 5 mg PRN for systolic >180, diastolic > 110  AKI: Creatinine on admission was 1.27, up from his baseline of about 1.0.  JYN:WGNFAOZHYQ is 19, so could be prerenal or possible HTN-induced injury. -NS at 100 mL/hr -follow up repeat BMP   Tobacco abuse: Patient reports 45 years of smoking, about a third of a pack per day, so 15 pack years.  He is trying to quit smoking but has not tried the nicotine patch that were recommended at his clinic visit. -nicotine patch   FEN/GI: NPO, zofran Prophylaxis: lovenox  Disposition: observation for ACS r/o   History of Present Illness:  Konstantin Lehnen is a 56 y.o. male presenting with chest pain.  He has a PMH of HTN and tobacco abuse.  He reports that he felt badly last night at around 8:30pm, but went into work anyway at 10:00pm.  He felt like he had gas pains, but the pains worsened and involved his left midchest after he got to work.  He thinks that the heat and the exertion of pushing carts at work exacerbated his pain.  He felt "queasy" and clammy and went to sit down.  He felt like his blood pressure was up, but he didn't check it.  His chest pain was sharp, located in the T5-T8 area of his left chest, and non-radiating.  He felt short of breath, nauseated, and describes his symptoms as waxing and waning.  He is trying to eat healthier and tried some new organic food last night and he is unsure if that upset his stomach.  He had some soft stools last night. His coworker brought him 4 chewable tabs of 81 mg ASA, and he said that helped his pain.  His coworkers then called EMS, and he was brought to the ED.  He also received a nitroglycerin patch in the ED, which also improved his pain.  He does not like sublingual NG as it gives him headaches.  He now feels pretty comfortable, although he reports some chest soreness.  He cannot tell if his chest pain feels different when he is lying down vs leaning forward.    Review Of  Systems: Per HPI with the following additions:   Review of Systems  Constitutional: Positive for diaphoresis. Negative for chills and fever.  Eyes: Negative for blurred vision and double vision.  Respiratory: Positive for cough and shortness of breath.   Cardiovascular: Positive for chest pain. Negative for leg swelling.  Gastrointestinal: Positive for diarrhea and nausea. Negative for vomiting.  Genitourinary: Negative for dysuria.  Musculoskeletal: Negative for falls and myalgias.  Neurological: Negative for tingling, sensory change and headaches.  Psychiatric/Behavioral: Negative for substance abuse.   Patient Active Problem List   Diagnosis Date Noted  . Chest pain 03/04/2017  . HTN (hypertension) 02/15/2017  . Tobacco abuse 02/15/2017   Past Medical History: Past Medical History:  Diagnosis Date  . HTN (hypertension)    Past Surgical History: Past Surgical History:  Procedure Laterality Date  . MANDIBLE FRACTURE SURGERY     Social History: Social History  Substance Use Topics  . Smoking status: Current Some Day Smoker  . Smokeless tobacco: Never Used  . Alcohol use Yes   Additional social history: third shift worker at [employer], lives with wife. Drinks a beer on Saturdays. Denies drug use.  Please also refer to relevant sections of EMR.  Family History: History reviewed. No pertinent family history. FH of HTN Father has CAD, is 4 years old, currently hospitalized. He has had TIAs, unsure if he has had MI in past  Allergies and Medications: No Known Allergies No current facility-administered medications on file prior to encounter.    Current Outpatient Prescriptions on File Prior to Encounter  Medication Sig Dispense Refill  . amLODipine (NORVASC) 5 MG tablet Take 2 tablets (10 mg total) by mouth daily. 60 tablet 0  . nicotine (NICODERM CQ - DOSED IN MG/24 HOURS) 14 mg/24hr patch Place 1 patch (14 mg total) onto the skin daily. 28 patch 0    Objective: BP (!) 156/115   Pulse 65   Temp (!) 97.3 F (36.3 C) (Oral)   Resp 14   SpO2 99%  Exam: General: comfortable appearing man lying in bed talking on the phone Eyes: EOMI, PERRL ENTM: no lymphadenopathy, slightly dry mucus membranes Neck: supple, nontender Cardiovascular: RRR, no MRG, tender to palpation on the left T5-T8 region Respiratory: CTAB, no increased work of breathing, can talk easily without shortness of breath Gastrointestinal: nontender to palpation, +bowel sounds in all four quadrants MSK: 5/5 strength in all extremities, full ROM Derm: no rashes or bruises visualized Neuro: CN II-XII grossly normal, AAO x 3 Psych: appropriate mood and affect  Labs and Imaging: CBC BMET   Recent Labs Lab 03/04/17 0340  WBC 8.4  HGB 13.2  HCT 41.1  PLT 217    Recent Labs Lab 03/04/17 0340  NA 137  K 3.6  CL 106  CO2 22  BUN 24*  CREATININE 1.27*  GLUCOSE 108*  CALCIUM 8.9     Dg Chest  2 View  Result Date: 03/04/2017 CLINICAL DATA:  56 year old male with chest pain EXAM: CHEST  2 VIEW COMPARISON:  None. FINDINGS: The lungs are clear. There is no pleural effusion or pneumothorax. There is mild cardiomegaly. No acute osseous pathology. IMPRESSION: No active cardiopulmonary disease. Electronically Signed   By: Elgie Collard M.D.   On: 03/04/2017 04:18   EKG at 0350 on 03/04/17:  Sinus rhythm Borderline prolonged PR interval Probable left atrial enlargement Incomplete right bundle branch block Left ventricular hypertrophy Anterior infarct, old Abnormal T, consider ischemia, lateral leads No old tracing to compare   Lennox Solders, MD 03/04/2017, 6:17 AM PGY-1, [facility] FPTS Intern pager: [phone], text pages welcome  FPTS Upper-Level Resident Addendum  I have independently interviewed and examined the patient. I have discussed the above with the original author and agree with their documentation. My edits for  correction/addition/clarification are in pink.Please see also any attending notes.   Dolores Patty, DO PGY-2, [facility] FPTS Service pager: [phone] (text pages welcome through [facility])

## 2017-03-04 NOTE — Progress Notes (Signed)
Pt is back on the floor from having stress test. No distress or pain noted. Will continue to monitor.

## 2017-03-04 NOTE — ED Triage Notes (Signed)
Pt to ED from work by EMS c/o centralized chest pain radiating under L ribcage x1 hour. Pt denies NV or SOB; reports being diaphoretic. Pt took 324mg  ASA pta

## 2017-03-05 ENCOUNTER — Observation Stay (HOSPITAL_BASED_OUTPATIENT_CLINIC_OR_DEPARTMENT_OTHER): Payer: BLUE CROSS/BLUE SHIELD

## 2017-03-05 DIAGNOSIS — R079 Chest pain, unspecified: Secondary | ICD-10-CM | POA: Diagnosis not present

## 2017-03-05 DIAGNOSIS — I10 Essential (primary) hypertension: Secondary | ICD-10-CM

## 2017-03-05 DIAGNOSIS — Z8249 Family history of ischemic heart disease and other diseases of the circulatory system: Secondary | ICD-10-CM | POA: Diagnosis not present

## 2017-03-05 DIAGNOSIS — I420 Dilated cardiomyopathy: Secondary | ICD-10-CM | POA: Diagnosis not present

## 2017-03-05 DIAGNOSIS — I428 Other cardiomyopathies: Secondary | ICD-10-CM

## 2017-03-05 HISTORY — DX: Essential (primary) hypertension: I10

## 2017-03-05 LAB — CBC
HCT: 42 % (ref 39.0–52.0)
Hemoglobin: 13.6 g/dL (ref 13.0–17.0)
MCH: 24.6 pg — ABNORMAL LOW (ref 26.0–34.0)
MCHC: 32.4 g/dL (ref 30.0–36.0)
MCV: 76.1 fL — AB (ref 78.0–100.0)
PLATELETS: 242 10*3/uL (ref 150–400)
RBC: 5.52 MIL/uL (ref 4.22–5.81)
RDW: 14.9 % (ref 11.5–15.5)
WBC: 6.2 10*3/uL (ref 4.0–10.5)

## 2017-03-05 LAB — ECHOCARDIOGRAM COMPLETE
AVLVOTPG: 2 mmHg
Area-P 1/2: 2.62 cm2
CHL CUP MV DEC (S): 285
E decel time: 285 msec
FS: 14 % — AB (ref 28–44)
HEIGHTINCHES: 63 in
IV/PV OW: 0.83
LA diam end sys: 38 mm
LA diam index: 2.11 cm/m2
LA vol A4C: 62.8 ml
LASIZE: 38 mm
LDCA: 4.52 cm2
LVOT SV: 67 mL
LVOT VTI: 14.9 cm
LVOT diameter: 24 mm
LVOTPV: 77 cm/s
Lateral S' vel: 16.5 cm/s
MV pk A vel: 81 m/s
MVPKEVEL: 45.4 m/s
MVSPHT: 84 ms
PW: 17 mm — AB (ref 0.6–1.1)
TAPSE: 16.1 mm
WEIGHTICAEL: 2710.4 [oz_av]

## 2017-03-05 LAB — BASIC METABOLIC PANEL
Anion gap: 6 (ref 5–15)
BUN: 12 mg/dL (ref 6–20)
CALCIUM: 9.1 mg/dL (ref 8.9–10.3)
CHLORIDE: 108 mmol/L (ref 101–111)
CO2: 25 mmol/L (ref 22–32)
Creatinine, Ser: 0.84 mg/dL (ref 0.61–1.24)
GFR calc Af Amer: >60 mL/min (ref >60)
GFR calc non Af Amer: >60 mL/min (ref >60)
Glucose, Bld: 91 mg/dL (ref 65–99)
Potassium: 3.9 mmol/L (ref 3.5–5.1)
SODIUM: 139 mmol/L (ref 135–145)

## 2017-03-05 MED ORDER — CARVEDILOL 3.125 MG PO TABS
3.1250 mg | ORAL_TABLET | Freq: Two times a day (BID) | ORAL | Status: DC
  Administered 2017-03-05 (×2): 3.125 mg via ORAL
  Filled 2017-03-05 (×2): qty 1

## 2017-03-05 MED ORDER — CARVEDILOL 6.25 MG PO TABS: 6.2500 mg | ORAL_TABLET | Freq: Two times a day (BID) | ORAL | Status: DC

## 2017-03-05 MED ORDER — LOSARTAN POTASSIUM 25 MG PO TABS
25.0000 mg | ORAL_TABLET | Freq: Every day | ORAL | Status: DC
  Administered 2017-03-05: 25 mg via ORAL
  Filled 2017-03-05: qty 1

## 2017-03-05 MED ORDER — ISOSORB DINITRATE-HYDRALAZINE 20-37.5 MG PO TABS: 1.0000 | ORAL_TABLET | Freq: Three times a day (TID) | ORAL | 0 refills | Status: DC

## 2017-03-05 MED ORDER — ISOSORB DINITRATE-HYDRALAZINE 20-37.5 MG PO TABS
1.0000 | ORAL_TABLET | Freq: Three times a day (TID) | ORAL | Status: DC
  Administered 2017-03-05 (×2): 1 via ORAL
  Filled 2017-03-05 (×2): qty 1

## 2017-03-05 MED ORDER — CARVEDILOL 3.125 MG PO TABS: 3.1250 mg | ORAL_TABLET | Freq: Two times a day (BID) | ORAL | 0 refills | Status: DC

## 2017-03-05 MED ORDER — LOSARTAN POTASSIUM 25 MG PO TABS: 25.0000 mg | ORAL_TABLET | Freq: Every day | ORAL | 0 refills | Status: DC

## 2017-03-05 MED ORDER — ASPIRIN 325 MG PO TBEC: 325.0000 mg | DELAYED_RELEASE_TABLET | Freq: Every day | ORAL | 0 refills | Status: DC

## 2017-03-05 NOTE — Discharge Summary (Signed)
Family Medicine Teaching [facility] Discharge Summary  Patient name: Jason Pitts Medical record number: [id number] Date of birth: 30-Oct-1960 Age: 56 y.o. Gender: male Date of Admission: 03/04/2017  Date of Discharge: 03/05/17 Admitting Physician: Latrelle Dodrill, MD  Primary Care Provider: Garth Bigness, MD Consultants: Cardiology  Indication for Hospitalization: Chest pain  Discharge Diagnoses/Problem List:  1.  Systolic Heart Failure, EF 30-35% 2.  Hypertension  Disposition: Home  Discharge Condition: stable, improved  Discharge Exam: please refer to progress note from day of discharge  Brief Hospital Course:  Mr. Rusek was admitted on 03/04/17 for ACS rule-out after he experienced left-sided chest pain.  EKG showed T-wave inversions, possible old anterior infarct, LVH, and left atrial enlargement.  Troponins were negative.  Cardiology was consulted and performed a nuclear stress test, which was negative for ischemia but found an EF of 26% and left ventricle dilatation.  An echo the following day revealed an EF of 30-35%.  Cardiology also recommended starting Coreg 3.125 mg BID and Bidil 20-27.5 mg TID as well as ASA 325 mg daily.  We also added losartan 25 mg daily for heart failure and blood pressure treatment, and we discontinued Norvasc 10 mg.  Blood pressure stabilized prior to discharge.  Issues for Follow Up:  1. Heart failure management 2. Blood pressure management  Significant Procedures: nuclear stress test, echo  Significant Labs and Imaging:   Recent Labs Lab 03/04/17 0340 03/05/17 0751  WBC 8.4 6.2  HGB 13.2 13.6  HCT 41.1 42.0  PLT 217 242    Recent Labs Lab 03/04/17 0340 03/05/17 0751  NA 137 139  K 3.6 3.9  CL 106 108  CO2 22 25  GLUCOSE 108* 91  BUN 24* 12  CREATININE 1.27* 0.84  CALCIUM 8.9 9.1     Results/Tests Pending at Time of Discharge: none  Discharge Medications:  Allergies as of 03/05/2017   No Known Allergies     Medication List    STOP taking these medications   amLODipine 5 MG tablet Commonly known as:  NORVASC     TAKE these medications   aspirin 325 MG EC tablet Take 1 tablet (325 mg total) by mouth daily.   carvedilol 3.125 MG tablet Commonly known as:  COREG Take 1 tablet (3.125 mg total) by mouth 2 (two) times daily with a meal.   isosorbide-hydrALAZINE 20-37.5 MG tablet Commonly known as:  BIDIL Take 1 tablet by mouth 3 (three) times daily.   losartan 25 MG tablet Commonly known as:  COZAAR Take 1 tablet (25 mg total) by mouth daily.   nicotine 14 mg/24hr patch Commonly known as:  NICODERM CQ - dosed in mg/24 hours Place 1 patch (14 mg total) onto the skin daily.       Discharge Instructions: Please refer to Patient Instructions section of EMR for full details.  Patient was counseled important signs and symptoms that should prompt return to medical care, changes in medications, dietary instructions, activity restrictions, and follow up appointments.   Follow-Up Appointments: Follow-up Information    Garth Bigness, MD Follow up on 03/15/2017.   Specialty:  Family Medicine Why:  Please go to your hospital follow-up appointment with Dr. Chanetta Marshall at 9:05 on 03/15/17. Contact information: [address] [place] [address] [phone]           Lennox Solders, MD 03/05/2017, 4:04 PM PGY-1, [facility] Family [facility]

## 2017-03-05 NOTE — Progress Notes (Signed)
DAILY PROGRESS NOTE   Patient Name: Jason Pitts Date of Encounter: 03/05/2017  Hospital Problem List   Principal Problem:   Dilated cardiomyopathy [facility]) Active Problems:   HTN (hypertension)   Tobacco abuse   Chest pain   Family history of early CAD   Malignant hypertension    Chief Complaint   No further chest pain  Subjective   Myoview yesterday demonstrated no ischemia - however, the LV is dilated with severe global hypokinesis - LVEF 26%. Echo has not been peformed. Blood pressure remains poorly controlled. He wants to go home  Objective   Vitals:   03/04/17 2024 03/05/17 0418 03/05/17 0545 03/05/17 0626  BP: (!) 167/103 (!) 161/118  (!) 167/122  Pulse: 63 65    Resp: _0 Temp: 98.6 F (37 C) 98.4 F (36.9 C)    TempSrc: Oral Oral    SpO2: 98% 100%  96%  Weight:  169 lb 6.4 oz (76.8 kg)    Height:        Intake/Output Summary (Last 24 hours) at 03/05/17 0856 Last data filed at 03/05/17 6962  Gross per 24 hour  Intake             1680 ml  Output              400 ml  Net             1280 ml   Filed Weights   03/04/17 0600 03/05/17 0418  Weight: 171 lb 12.8 oz (77.9 kg) 169 lb 6.4 oz (76.8 kg)    Physical Exam   General appearance: alert and no distress Neck: no carotid bruit, no JVD and thyroid not enlarged, symmetric, no tenderness/mass/nodules Lungs: clear to auscultation bilaterally Heart: regular rate and rhythm Abdomen: soft, non-tender; bowel sounds normal; no masses,  no organomegaly Extremities: extremities normal, atraumatic, no cyanosis or edema Pulses: 2+ and symmetric Skin: Skin color, texture, turgor normal. No rashes or lesions Neurologic: Grossly normal Psych: Pleasant  Inpatient Medications    Scheduled Meds: . amLODipine  10 mg Oral Daily  . aspirin EC  325 mg Oral Daily  . enoxaparin (LOVENOX) injection  40 mg Subcutaneous Q24H  . isosorbide-hydrALAZINE  1 tablet Oral TID  . nicotine  14 mg Transdermal Daily     Continuous Infusions: . sodium chloride 100 mL/hr at 03/05/17 0615    PRN Meds: acetaminophen, gi cocktail, hydrALAZINE, morphine injection, ondansetron (ZOFRAN) IV    Labs   Results for orders placed or performed during the hospital encounter of 03/04/17 (from the past 48 hour(s))  Basic metabolic panel     Status: Abnormal   Collection Time: 03/04/17  3:40 AM  Result Value Ref Range   Sodium 137 135 - 145 mmol/L   Potassium 3.6 3.5 - 5.1 mmol/L   Chloride 106 101 - 111 mmol/L   CO2 22 22 - 32 mmol/L   Glucose, Bld 108 (H) 65 - 99 mg/dL   BUN 24 (H) 6 - 20 mg/dL   Creatinine, Ser 1.27 (H) 0.61 - 1.24 mg/dL   Calcium 8.9 8.9 - 10.3 mg/dL   GFR calc non Af Amer >60 >60 mL/min   GFR calc Af Amer >60 >60 mL/min    Comment: (NOTE) The eGFR has been calculated using the CKD EPI equation. This calculation has not been validated in all clinical situations. eGFR's persistently <60 mL/min signify possible Chronic Kidney Disease.    Anion gap 9 5 -  15  CBC     Status: Abnormal   Collection Time: 03/04/17  3:40 AM  Result Value Ref Range   WBC 8.4 4.0 - 10.5 K/uL   RBC 5.36 4.22 - 5.81 MIL/uL   Hemoglobin 13.2 13.0 - 17.0 g/dL   HCT 41.1 39.0 - 52.0 %   MCV 76.7 (L) 78.0 - 100.0 fL   MCH 24.6 (L) 26.0 - 34.0 pg   MCHC 32.1 30.0 - 36.0 g/dL   RDW 15.0 11.5 - 15.5 %   Platelets 217 150 - 400 K/uL  I-stat troponin, ED     Status: None   Collection Time: 03/04/17  3:45 AM  Result Value Ref Range   Troponin i, poc 0.03 0.00 - 0.08 ng/mL   Comment 3            Comment: Due to the release kinetics of cTnI, a negative result within the first hours of the onset of symptoms does not rule out myocardial infarction with certainty. If myocardial infarction is still suspected, repeat the test at appropriate intervals.   Troponin I-serum (0, 3, 6 hours)     Status: Abnormal   Collection Time: 03/04/17  6:33 AM  Result Value Ref Range   Troponin I 0.04 (HH) <0.03 ng/mL     Comment: REPEATED TO VERIFY CRITICAL RESULT CALLED TO, READ BACK BY AND VERIFIED WITH: MUELLER,M RN @ [phone] 03/04/17 LEONARD,A   Troponin I-serum (0, 3, 6 hours)     Status: Abnormal   Collection Time: 03/04/17  8:53 AM  Result Value Ref Range   Troponin I 0.04 (HH) <0.03 ng/mL    Comment: CRITICAL VALUE NOTED.  VALUE IS CONSISTENT WITH PREVIOUSLY REPORTED AND CALLED VALUE.  Lipid panel     Status: None   Collection Time: 03/04/17  8:53 AM  Result Value Ref Range   Cholesterol 160 0 - 200 mg/dL   Triglycerides 85 <150 mg/dL   HDL 44 >40 mg/dL   Total CHOL/HDL Ratio 3.6 RATIO   VLDL 17 0 - 40 mg/dL   LDL Cholesterol 99 0 - 99 mg/dL    Comment:        Total Cholesterol/HDL:CHD Risk Coronary Heart Disease Risk Table                     Men   Women  1/2 Average Risk   3.4   3.3  Average Risk       5.0   4.4  2 X Average Risk   9.6   7.1  3 X Average Risk  23.4   11.0        Use the calculated Patient Ratio above and the CHD Risk Table to determine the patient's CHD Risk.        ATP III CLASSIFICATION (LDL):  <100     mg/dL   Optimal  100-129  mg/dL   Near or Above                    Optimal  130-159  mg/dL   Borderline  160-189  mg/dL   High  >190     mg/dL   Very High   Troponin I-serum (0, 3, 6 hours)     Status: Abnormal   Collection Time: 03/04/17  4:00 PM  Result Value Ref Range   Troponin I 0.03 (HH) <0.03 ng/mL    Comment: CRITICAL VALUE NOTED.  VALUE IS CONSISTENT WITH PREVIOUSLY REPORTED AND CALLED VALUE.  ECG   N/A- Personally Reviewed  Telemetry   Sinus rhythm - Personally Reviewed  Radiology    Dg Chest 2 View  Result Date: 03/04/2017 CLINICAL DATA:  56 year old male with chest pain EXAM: CHEST  2 VIEW COMPARISON:  None. FINDINGS: The lungs are clear. There is no pleural effusion or pneumothorax. There is mild cardiomegaly. No acute osseous pathology. IMPRESSION: No active cardiopulmonary disease. Electronically Signed   By: Anner Crete M.D.    On: 03/04/2017 04:18   Nm Myocar Multi W/spect W/wall Motion / Ef  Result Date: 03/04/2017  There was no ST segment deviation noted during stress.  Nuclear stress EF: 26%. The left ventricular ejection fraction is severely decreased (<30%).  There is no ischemia.  The LV is severely dilated. There is global hypokinesis.  This is a high risk study based on the reduced LV function.     Cardiac Studies    There was no ST segment deviation noted during stress.  Nuclear stress EF: 26%. The left ventricular ejection fraction is severely decreased (<30%).  There is no ischemia.  The LV is severely dilated. There is global hypokinesis.  This is a high risk study based on the reduced LV function.  Assessment   Principal Problem:   Dilated cardiomyopathy ([place]) Active Problems:   HTN (hypertension)   Tobacco abuse   Chest pain   Family history of early CAD   Malignant hypertension   Plan   1. Mr. Ake had a non-ischemic myoview, however, was noted to have a dilated cardiomyopathy with EF 26%. Will get an echo to confirm this. May be due to malignant hypertension. Will add bidil and carvedilol for additional BP control today. Does not appear volume overloaded on exam. Would not d/c until we can confirm LV function and BP is adequately controlled.  Time Spent Directly with Patient:  I have spent a total of 15 minutes with the patient reviewing hospital notes, telemetry, EKGs, labs and examining the patient as well as establishing an assessment and plan that was discussed personally with the patient. > 50% of time was spent in direct patient care.  Length of Stay:  LOS: 0 days   Pixie Casino, MD, [place]  Attending Cardiologist  Direct Dial: [phone]  Fax: [phone]  Website:  [email or web address] 03/05/2017, 8:56 AM

## 2017-03-05 NOTE — Progress Notes (Signed)
Family Medicine Teaching Service Daily Progress Note Intern Pager: [phone]  Patient name: Jason Pitts Medical record number: [id number] Date of birth: [birth year] Age: 56 y.o. Gender: male  Primary Care Provider: Garth Bigness, MD Consultants: Cardiology Code Status: FULL  Pt Overview and Major Events to Date:  Jason Pitts was admitted on 03/04/17 for chest pain.  His troponins were measured and followed, and they were 0.03, 0.04, and 0.04.  An EKG in the ED was concerning for T wave inversions, right bundle branch block, left atrial enlargement, and LVH.  His chest pain had mostly resolved after arrival, but due to his risk factors and EKG, cardiology was consulted.  They performed a nuclear stress test on 03/04/17, which showed no ischemia but an EF of 26% and left ventricle dilation.  Assessment and Plan:  Jason Pitts is a 56 y.o. male presenting with chest pain. PMH is significant for HTN and tobacco abuse.    Chest pain: DDx includes MI, stable angina, GERD, costochondritis, PE.  Chest pain started at around 8:30pm on 03/03/17 and was accompanied by gas pains.  Described as "sharp," non-radiating, patient not sure if it is related to exertion or not.  Also endorses throbbing frontal headache, shortness of breath, nausea without emesis, and diaphoresis.  He took 4 ASA for this, which helped to relieve his symptoms.  Nitroglycerin patch given in ED also helped alleviate his pain, and he is asymptomatic except for chest soreness at this time.  EKG concerning for T wave inversion in lateral leads, incomplete right bundle branch block, left atrial enlargement, no EKG available for comparison.  CXR wnl.  POC Troponin negative x 1.  GI source of pain likely given location and sharp nature of pain; however patient has many risk factors for ACS, so this must be ruled out.  Heart score 4. Denies leg swelling and leg pain, shortness of breath has resolved, so clinical likelihood of PE is low.   Lipid panel from 03/04/17 was wnl. -cardiology consulted, appreciate recommendations -daily ASA  -start Coreg 3.125 mg BID -2mg  morphine q2 PRN -echo 03/05/17 -GI cocktail PRN -continuous pulse ox -vitals per unit routine -f/u on A1C result -Echo  HTN: BP has been 140s-150s/110's since admission, was 160/118 at clinic visit on 02/15/17.  His Norvasc was increased from 5 mg to 10 mg by PCP at that time, is compliant with this and reports that he took it on 03/03/17 am.  Hydralazine given for elevated BP at 0400 did not lower BP, so morning dose of 10 mg Norvasc given earlier than prescription time. -discontinue Norvasc and add Losartan 25 mg daily -hydralazine 5 mg PRN for systolic >180, diastolic > 110 -Coreg 3.125 mg BID, Bidil 20-37.5 mg TID added for better control  AKI: Creatinine on admission was 1.27, up from his baseline of about 1.0.  Jason Pitts is 18, so could be prerenal or possible HTN-induced injury.  Creatinine 0.84 on 03/05/17 -NS at 100 mL/hr -follow up repeat BMP   Tobacco abuse: Patient reports 45 years of smoking, about a third of a pack per day, so 15 pack years.  He is trying to quit smoking but has not tried the nicotine patch that were recommended at his clinic visit. -nicotine patch   FEN/GI: NPO, zofran Prophylaxis: lovenox  Disposition: home  Subjective:  Jason Pitts continues to feel improved and denies any chest pain except for some lingering soreness in the T5-T8 region of his left chest.  He received his nuclear stress test  results yesterday and understands that his heart isn't pumping as well as it should.  He is ready to go home soon.  Objective: Temp:  [98.4 F (36.9 C)-98.6 F (37 C)] 98.4 F (36.9 C) (07/17 0418) Pulse Rate:  [52-82] 65 (07/17 0418) Resp:  [12-18] 13 (07/17 0626) BP: (138-167)/(91-122) 167/122 (07/17 0626) SpO2:  [96 %-100 %] 96 % (07/17 0626) Weight:  [169 lb 6.4 oz (76.8 kg)] 169 lb 6.4 oz (76.8 kg) (07/17 0418) Physical  Exam: General: pleasant, well-nourished male talking on the phone when I arrived, in NAD Cardiovascular: RRR, no MRG Respiratory: CTAB Abdomen: soft, nontender to palpation, +bowel sounds Extremities: moves all extremities spontaneously  Laboratory:  Recent Labs Lab 03/04/17 0340 03/05/17 0751  WBC 8.4 6.2  HGB 13.2 13.6  HCT 41.1 42.0  PLT 217 242    Recent Labs Lab 03/04/17 0340 03/05/17 0751  NA 137 139  K 3.6 3.9  CL 106 108  CO2 22 25  BUN 24* 12  CREATININE 1.27* 0.84  CALCIUM 8.9 9.1  GLUCOSE 108* 91     Imaging/Diagnostic Tests: Dg Chest 2 View  Result Date: 03/04/2017 CLINICAL DATA:  56 year old male with chest pain EXAM: CHEST  2 VIEW COMPARISON:  None. FINDINGS: The lungs are clear. There is no pleural effusion or pneumothorax. There is mild cardiomegaly. No acute osseous pathology. IMPRESSION: No active cardiopulmonary disease. Electronically Signed   By: Elgie Collard M.D.   On: 03/04/2017 04:18   Nm Myocar Multi W/spect W/wall Motion / Ef  Result Date: 03/04/2017  There was no ST segment deviation noted during stress.  Nuclear stress EF: 26%. The left ventricular ejection fraction is severely decreased (<30%).  There is no ischemia.  The LV is severely dilated. There is global hypokinesis.  This is a high risk study based on the reduced LV function.      Lennox Solders, MD 03/05/2017, 7:24 AM PGY-1, [facility] Family Medicine FPTS Intern pager: [phone], text pages welcome

## 2017-03-05 NOTE — Progress Notes (Signed)
  Echocardiogram 2D Echocardiogram has been performed.  Jason Pitts 03/05/2017, 11:05 AM

## 2017-03-05 NOTE — Discharge Instructions (Signed)
Please stop taking Norvasc for your blood pressure, and start taking Losartan once a day, Coreg twice per day, and BiDil three times per day.  Please also take aspirin 325 mg once per day.

## 2017-03-05 NOTE — Plan of Care (Signed)
Problem: Safety: Goal: Ability to remain free from injury will improve Outcome: Progressing Fall risk bundle in place. No skin break down, falls or other injuries this shift. Will continue to monitor and assess this shift.   Problem: Pain Managment: Goal: General experience of comfort will improve Outcome: Progressing No complaints of pain this shift.

## 2017-03-05 NOTE — Progress Notes (Signed)
Pt was upset that he was not told that he was being discharged tonight. Pt requested that Dr. Natale Milch speak with him regarding discharge and medications. Dr. Natale Milch was paged and came to speak with pt. Pt stated he had all of his questions answered and felt comfortable with being discharge.

## 2017-03-06 ENCOUNTER — Telehealth: Payer: Self-pay | Admitting: Family Medicine

## 2017-03-06 LAB — HEMOGLOBIN A1C
HEMOGLOBIN A1C: 6.1 % — AB (ref 4.8–5.6)
Mean Plasma Glucose: 128 mg/dL

## 2017-03-06 NOTE — Telephone Encounter (Signed)
Late Entry PCP note:   Called patient's room x 2 on 7/16 to check on him (did not see in person as there was concern for shingles). No answer. Appreciate the excellent care of the inpatient team. Will look forward to seeing him on 7/27.   Loni Muse, MD

## 2017-03-15 ENCOUNTER — Ambulatory Visit (INDEPENDENT_AMBULATORY_CARE_PROVIDER_SITE_OTHER): Payer: BLUE CROSS/BLUE SHIELD | Admitting: Family Medicine

## 2017-03-15 ENCOUNTER — Encounter: Payer: Self-pay | Admitting: Family Medicine

## 2017-03-15 DIAGNOSIS — I10 Essential (primary) hypertension: Secondary | ICD-10-CM | POA: Diagnosis not present

## 2017-03-15 DIAGNOSIS — I420 Dilated cardiomyopathy: Secondary | ICD-10-CM | POA: Diagnosis not present

## 2017-03-15 DIAGNOSIS — Z720 Tobacco use: Secondary | ICD-10-CM | POA: Diagnosis not present

## 2017-03-15 MED ORDER — LOSARTAN POTASSIUM 25 MG PO TABS: 50.0000 mg | ORAL_TABLET | Freq: Every day | ORAL | 0 refills | Status: DC

## 2017-03-15 NOTE — Patient Instructions (Signed)
It was a pleasure to see you today! Thank you for choosing [facility] for your primary care. Jason Pitts was seen for blood pressure.   Our plans for today were:  Increase your losartan to 50mg   Google Bidil patient assistance and fill out that form to try to get the cost down.   To keep you healthy, we need to monitor some screening tests. You are due for colonoscopy. Please schedule these.   You should return to our clinic to see Dr. Chanetta Marshall in 2 months for HTN.   Best,  Dr. Chanetta Marshall

## 2017-03-15 NOTE — Assessment & Plan Note (Signed)
Poorly controlled. Did not get Bidil due to cost. Prioritizing losartan due to CHF. Increase losartan to 50mg  QD and RTC for lab appt next week to recheck BMP. Patient given instructions to fill out patient assistance program online for bidil which should bring cost to $25 per month.

## 2017-03-15 NOTE — Assessment & Plan Note (Signed)
Currently smoking. Wants to quit with patches but needs to prioritize medication costs with his income. He will work on getting patches next month.

## 2017-03-15 NOTE — Progress Notes (Signed)
   CC: hospital follow up   HPI Hosp follow up: hx of resistant HTN, admitted for ACS workup, found to have new onset CHF. Cardiology following Dr. Rennis Golden. Started on bidil, losartan, coreg.   Since discharge, has been taking ASA, losartan, coreg. Bidil was $300, so he did not pick this up. Chest pain no. Taking it easy activity wise. BP checks at home - can't recall numbers. "Good for him." Wife is helping manage meds. Taking MVI.   CC, SH/smoking status, and VS noted  Objective: BP (!) 160/108   Pulse 61   Temp 98 F (36.7 C) (Oral)   Ht 5\' 3"  (1.6 m)   Wt 173 lb 12.8 oz (78.8 kg)   SpO2 94%   BMI 30.79 kg/m  Gen: NAD, alert, cooperative, and pleasant. HEENT: NCAT, EOMI, PERRL CV: RRR, no murmur Resp: CTAB, no wheezes, non-labored Abd: SNTND, BS present, no guarding or organomegaly Ext: No edema, warm Neuro: Alert and oriented, Speech clear, No gross deficits  Assessment and plan:  HTN (hypertension) Poorly controlled. Did not get Bidil due to cost. Prioritizing losartan due to CHF. Increase losartan to 50mg  QD and RTC for lab appt next week to recheck BMP. Patient given instructions to fill out patient assistance program online for bidil which should bring cost to $25 per month.   Dilated cardiomyopathy (HCC) Cards appt made for 8/16. Patient unable to afford bidil, gave instructions for discount program. Increased losartan. No signs of volume overload on exam.   Tobacco abuse Currently smoking. Wants to quit with patches but needs to prioritize medication costs with his income. He will work on getting patches next month.    Orders Placed This Encounter  Procedures  . Basic metabolic panel    Standing Status:   Future    Standing Expiration Date:   03/15/2018    Meds ordered this encounter  Medications  . losartan (COZAAR) 25 MG tablet    Sig: Take 2 tablets (50 mg total) by mouth daily.    Dispense:  120 tablet    Refill:  0    Health Maintenance reviewed -  patient asked to schedule his colonoscopy.  Loni Muse, MD, PGY2 03/15/2017 12:23 PM

## 2017-03-15 NOTE — Assessment & Plan Note (Signed)
Cards appt made for 8/16. Patient unable to afford bidil, gave instructions for discount program. Increased losartan. No signs of volume overload on exam.

## 2017-03-18 ENCOUNTER — Other Ambulatory Visit: Payer: BLUE CROSS/BLUE SHIELD

## 2017-03-18 DIAGNOSIS — Z1159 Encounter for screening for other viral diseases: Secondary | ICD-10-CM

## 2017-03-18 DIAGNOSIS — I10 Essential (primary) hypertension: Secondary | ICD-10-CM

## 2017-03-19 ENCOUNTER — Telehealth: Payer: Self-pay | Admitting: Family Medicine

## 2017-03-19 LAB — CBC
HEMOGLOBIN: 13 g/dL (ref 13.0–17.7)
Hematocrit: 41.4 % (ref 37.5–51.0)
MCH: 24.3 pg — ABNORMAL LOW (ref 26.6–33.0)
MCHC: 31.4 g/dL — ABNORMAL LOW (ref 31.5–35.7)
MCV: 77 fL — ABNORMAL LOW (ref 79–97)
Platelets: 265 10*3/uL (ref 150–379)
RBC: 5.35 x10E6/uL (ref 4.14–5.80)
RDW: 16.3 % — ABNORMAL HIGH (ref 12.3–15.4)
WBC: 6.5 10*3/uL (ref 3.4–10.8)

## 2017-03-19 LAB — HIV ANTIBODY (ROUTINE TESTING W REFLEX): HIV SCREEN 4TH GENERATION: NONREACTIVE

## 2017-03-19 LAB — BASIC METABOLIC PANEL
BUN/Creatinine Ratio: 13 (ref 9–20)
BUN: 14 mg/dL (ref 6–24)
CALCIUM: 9.3 mg/dL (ref 8.7–10.2)
CHLORIDE: 101 mmol/L (ref 96–106)
CO2: 24 mmol/L (ref 20–29)
Creatinine, Ser: 1.09 mg/dL (ref 0.76–1.27)
GFR calc Af Amer: 87 mL/min/{1.73_m2} (ref >59)
GFR, EST NON AFRICAN AMERICAN: 75 mL/min/{1.73_m2} (ref >59)
Glucose: 102 mg/dL — ABNORMAL HIGH (ref 65–99)
POTASSIUM: 4.2 mmol/L (ref 3.5–5.2)
Sodium: 142 mmol/L (ref 134–144)

## 2017-03-19 LAB — HEPATITIS C ANTIBODY: Hep C Virus Ab: <0.1 s/co ratio (ref 0.0–0.9)

## 2017-03-19 NOTE — Telephone Encounter (Signed)
Would like to talk to dr Chanetta Marshall about his papers from [place] about returning to work

## 2017-03-20 NOTE — Telephone Encounter (Signed)
Pt came by to see if dr. Chanetta Marshall had received papers from [facility] and I told him that she said she has not received them.  Pt will have them faxed as well as drop a copy off tomorrow he said. Lamonte Sakai, Zaine Elsass D, [name]

## 2017-03-20 NOTE — Telephone Encounter (Signed)
Left a message for patient to call back at his convenience.

## 2017-03-22 ENCOUNTER — Telehealth: Payer: Self-pay | Admitting: Family Medicine

## 2017-03-22 NOTE — Telephone Encounter (Signed)
Form completed, given to RN.

## 2017-03-22 NOTE — Telephone Encounter (Signed)
Tried calling patient to inform him that FMLA forms were faxed, copied for scanning in patient's chart and original copy placed up front for pickup.  Clovis Pu, RN

## 2017-03-22 NOTE — Telephone Encounter (Signed)
FMLA form dropped off for at front desk for completion.  Verified that patient section of form has been completed.  New patient appt with PCP was 02/15/17.  Placed form in team folder to be completed by clinical staff.  Chari Manning

## 2017-03-22 NOTE — Telephone Encounter (Signed)
Clinical info completed on FMLA form.  Place form in Dr. Christena Flake box for completion.  Sunday Spillers, CMA

## 2017-03-25 ENCOUNTER — Other Ambulatory Visit: Payer: Self-pay | Admitting: Family Medicine

## 2017-03-27 NOTE — Telephone Encounter (Signed)
Will not refill. This was discontinued at most recent hospitalization.

## 2017-03-28 ENCOUNTER — Other Ambulatory Visit: Payer: Self-pay | Admitting: Family Medicine

## 2017-03-28 NOTE — Telephone Encounter (Signed)
Called to inform pt that the Rx will be at the requested pharmacy and to let him know for future reference that the [facility] pharmacies are connected and that he can just call the pharmacy he wants to have it filled at and they should be able to see the Rx in their system in case it is sent to a different [facility]. Lamonte Sakai, Eivan Gallina D, [name]

## 2017-03-28 NOTE — Telephone Encounter (Signed)
Please resend losartan to the [facility] on [address]

## 2017-03-28 NOTE — Telephone Encounter (Signed)
We do not need to duplicate this prescription to transfer from [facility] to [place] (it was sent to [facility] on [address]). He can call the [facility] on [address] to have them pull up the losartan in their system - it is all connected.

## 2017-04-08 ENCOUNTER — Telehealth: Payer: Self-pay | Admitting: Family Medicine

## 2017-04-08 NOTE — Telephone Encounter (Signed)
Please call patient regarding paperwork dropped off as [employer] is saying that one day of leave is not long enough. Patient would like for you to call [employer] (number on paperwork),     Worker comp form dropped off again at front desk for completion.  Verified that patient section of form has been completed.  New patient visit with PCP was 02/15/17 .  Placed form in team folder to be completed by clinical staff.  Chari Manning

## 2017-04-10 NOTE — Telephone Encounter (Signed)
Clinical info completed on FMLA form.  Place form in Dr. Timberlake's box for completion.  Zimmerman Rumple, Juli Odom D, CMA  

## 2017-04-11 NOTE — Telephone Encounter (Signed)
Completed FMLA paperwork once again. Patient previously told me that the paperwork was to cover his admission in July. I have adapted the paperwork to attempt to explain that he could be hospitalized in the future for additional heart failure exacerbations. In my medical opinion, there is no reason he can't go to work right now. I have filled out the forms to the best of my ability. Forms given to RN box.

## 2017-04-12 NOTE — Telephone Encounter (Signed)
Patient informed that disability forms are completed and faxed to [facility] [phone]. Forms copied for scanning in patient's record.  Original copy placed up front for pickup.  Clovis Pu, RN

## 2017-04-18 ENCOUNTER — Ambulatory Visit (INDEPENDENT_AMBULATORY_CARE_PROVIDER_SITE_OTHER): Payer: BLUE CROSS/BLUE SHIELD | Admitting: Internal Medicine

## 2017-04-18 ENCOUNTER — Encounter: Payer: Self-pay | Admitting: Internal Medicine

## 2017-04-18 VITALS — BP 180/124 | HR 63 | Ht 63.0 in | Wt 172.8 lb

## 2017-04-18 DIAGNOSIS — I5043 Acute on chronic combined systolic (congestive) and diastolic (congestive) heart failure: Secondary | ICD-10-CM | POA: Diagnosis not present

## 2017-04-18 DIAGNOSIS — Z79899 Other long term (current) drug therapy: Secondary | ICD-10-CM

## 2017-04-18 DIAGNOSIS — Z720 Tobacco use: Secondary | ICD-10-CM

## 2017-04-18 DIAGNOSIS — I10 Essential (primary) hypertension: Secondary | ICD-10-CM

## 2017-04-18 DIAGNOSIS — I420 Dilated cardiomyopathy: Secondary | ICD-10-CM | POA: Diagnosis not present

## 2017-04-18 DIAGNOSIS — Z01810 Encounter for preprocedural cardiovascular examination: Secondary | ICD-10-CM

## 2017-04-18 MED ORDER — HYDRALAZINE HCL 25 MG PO TABS: 25.0000 mg | ORAL_TABLET | Freq: Three times a day (TID) | ORAL | 5 refills | Status: DC

## 2017-04-18 MED ORDER — SACUBITRIL-VALSARTAN 24-26 MG PO TABS: 1.0000 | ORAL_TABLET | Freq: Two times a day (BID) | ORAL | 5 refills | Status: DC

## 2017-04-18 NOTE — Patient Instructions (Addendum)
Your physician has recommended you make the following change in your medication:  -- START hydralazine 25mg  twice daily -- START entresto 24/26mg  twice daily -- STOP losartan  Your physician recommends that you return for lab work in: ONE WEEK (BMET)  Dr. Rennis Golden has requested that you schedule an appointment with one of our clinical pharmacists for a blood pressure check appointment within the next 3-4 weeks.  -- if you monitor your blood pressure (BP) at home, please bring your BP cuff and your BP readings with you to this appointment -- please check your BP no more than twice daily, after you have been sitting/resting for 5-10 minutes, at least 1 hour after taking your BP medications  Your physician recommends that you schedule a follow-up appointment in: TWO MONTHS with Dr. Rennis Golden. Jason Pitts

## 2017-04-20 DIAGNOSIS — Z01810 Encounter for preprocedural cardiovascular examination: Secondary | ICD-10-CM | POA: Insufficient documentation

## 2017-04-20 NOTE — Progress Notes (Signed)
OFFICE FOLLOW-UP NOTE  Chief Complaint:  Follow-up hospitalization, preoperative clearance  Primary Care Physician: Garth Bigness, MD  HPI:  Jason Pitts is a 56 y.o. male with a past medial history significant for chest pain, tobacco abuse, malignant hypertension, family history of premature coronary artery disease, and recent admission for chest pain. He ruled out for MI and underwent a nuclear stress test which was negative for ischemia however showed severely dilated LV with global hypokinesis and EF of 26%. Echo on 03/05/2017 showed an EF of 30-35% with global hypokinesis and a dilated aortic root of 4.3 cm. He was placed on appropriate medications including BiDil, but reported that was too expensive for him and did not give the medicine filled. Today he returns for follow-up. Blood pressure initially was elevated at 180/124. He denies headache, blurred vision, decreased urine output or chest pain.  PMHx:  Past Medical History:  Diagnosis Date  . HTN (hypertension)   . Tobacco use     Past Surgical History:  Procedure Laterality Date  . MANDIBLE FRACTURE SURGERY      FAMHx:  Family History  Problem Relation Age of Onset  . Heart failure Father   . CAD Father        79's    SOCHx:   reports that he has been smoking.  He has a 12.00 pack-year smoking history. He has never used smokeless tobacco. He reports that he drinks alcohol. He reports that he does not use drugs.  ALLERGIES:  No Known Allergies  ROS: Pertinent items noted in HPI and remainder of comprehensive ROS otherwise negative.  HOME MEDS: Current Outpatient Prescriptions on File Prior to Visit  Medication Sig Dispense Refill  . aspirin EC 325 MG EC tablet Take 1 tablet (325 mg total) by mouth daily. 30 tablet 0  . carvedilol (COREG) 3.125 MG tablet Take 1 tablet (3.125 mg total) by mouth 2 (two) times daily with a meal. 60 tablet 0  . nicotine (NICODERM CQ - DOSED IN MG/24 HOURS) 14 mg/24hr patch  Place 1 patch (14 mg total) onto the skin daily. 28 patch 0   No current facility-administered medications on file prior to visit.     LABS/IMAGING: No results found for this or any previous visit (from the past 48 hour(s)). No results found.  LIPID PANEL:    Component Value Date/Time   CHOL 160 03/04/2017 0853   TRIG 85 03/04/2017 0853   HDL 44 03/04/2017 0853   CHOLHDL 3.6 03/04/2017 0853   VLDL 17 03/04/2017 0853   LDLCALC 99 03/04/2017 0853     WEIGHTS: Wt Readings from Last 3 Encounters:  04/18/17 172 lb 12.8 oz (78.4 kg)  03/15/17 173 lb 12.8 oz (78.8 kg)  03/05/17 169 lb 6.4 oz (76.8 kg)    VITALS: BP (!) 180/124   Pulse 63   Ht 5\' 3"  (1.6 m)   Wt 172 lb 12.8 oz (78.4 kg)   SpO2 98%   BMI 30.61 kg/m   EXAM: General appearance: alert and no distress Neck: no carotid bruit, no JVD and thyroid not enlarged, symmetric, no tenderness/mass/nodules Lungs: clear to auscultation bilaterally Heart: regular rate and rhythm, S1, S2 normal, no murmur, click, rub or gallop Abdomen: soft, non-tender; bowel sounds normal; no masses,  no organomegaly Extremities: extremities normal, atraumatic, no cyanosis or edema Pulses: 2+ and symmetric Skin: Skin color, texture, turgor normal. No rashes or lesions Neurologic: Grossly normal Psych: Pleasant  EKG: Deferred - personally reviewed  ASSESSMENT: 1.  Uncontrolled hypertension 2. Acute systolic congestive heart failure 3. 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 Intermediate, but acceptable risk for colonoscopy  PLAN: 1.  [name] has uncontrolled hypertension and heart failure. He would benefit from improved blood pressure control and afterload reduction. I recommend starting hydralazine 25 mg twice a day. We'll stop losartan and switch him to Entresto 24/26 mg twice a day. He'll need a repeat metabolic profile in one week. He also needs a repeat blood pressure check in about a month. I'll plan to see him back personally in 2 months.  He is at acceptable risk for colonoscopy given his nonischemic Myoview. I would avoid excess IV fluids. I also advised him to switch aspirin from 325 to 81 mg daily. He should continue carvedilol.  Chrystie Nose, MD, [facility]    [facility]  Attending Cardiologist  Direct Dial: [phone]  Fax: [phone]  Website:  [email or web address] 04/20/2017, 3:52 PM

## 2017-04-23 ENCOUNTER — Telehealth: Payer: Self-pay | Admitting: Internal Medicine

## 2017-04-23 NOTE — Telephone Encounter (Signed)
[facility] 1. Type of surgery: colonoscopy - screening for colon cancer 2. Date of surgery: 05/31/2017 3. Surgeon: Dr. Marca Ancona 4. Medications that need to be held & how long: HOLD aspirin 7 days prior to surgery, restart after 5. Fax and/or Phone: (p) [phone]  (f) [phone]  ________________________ Arneta Cliche clearance form to [facility] - patient cleared for procedure per MD

## 2017-05-06 MED FILL — hydrALAZINE HCL 25 MG TABS: 25 | 30 days supply | Qty: 60 | Fill #0

## 2017-05-06 MED FILL — ENTRESTO 24 MG-26 MG TABLET: 24-26 | 30 days supply | Qty: 60 | Fill #0

## 2017-05-14 ENCOUNTER — Ambulatory Visit (INDEPENDENT_AMBULATORY_CARE_PROVIDER_SITE_OTHER): Payer: BLUE CROSS/BLUE SHIELD | Admitting: Family Medicine

## 2017-05-14 ENCOUNTER — Encounter: Payer: Self-pay | Admitting: Family Medicine

## 2017-05-14 ENCOUNTER — Ambulatory Visit (HOSPITAL_COMMUNITY)
Admission: RE | Admit: 2017-05-14 | Discharge: 2017-05-14 | Disposition: A | Discharge status: Home / Self Care | Payer: BLUE CROSS/BLUE SHIELD | Source: Ambulatory Visit | Attending: Family Medicine | Admitting: Family Medicine

## 2017-05-14 VITALS — BP 156/108 | HR 68 | Temp 98.1°F | Ht 63.0 in | Wt 173.0 lb

## 2017-05-14 DIAGNOSIS — R0989 Other specified symptoms and signs involving the circulatory and respiratory systems: Secondary | ICD-10-CM | POA: Insufficient documentation

## 2017-05-14 DIAGNOSIS — I499 Cardiac arrhythmia, unspecified: Secondary | ICD-10-CM | POA: Diagnosis not present

## 2017-05-14 DIAGNOSIS — I10 Essential (primary) hypertension: Secondary | ICD-10-CM | POA: Diagnosis not present

## 2017-05-14 DIAGNOSIS — R9431 Abnormal electrocardiogram [ECG] [EKG]: Secondary | ICD-10-CM | POA: Insufficient documentation

## 2017-05-14 DIAGNOSIS — I517 Cardiomegaly: Secondary | ICD-10-CM | POA: Diagnosis not present

## 2017-05-14 DIAGNOSIS — I491 Atrial premature depolarization: Secondary | ICD-10-CM | POA: Diagnosis not present

## 2017-05-14 NOTE — Patient Instructions (Signed)
It was a pleasure to see you today! Thank you for choosing [facility] for your primary care. Jason Pitts was seen for blood pressure.   Our plans for today were:  Be sure to keep your appt with the cardiologist on Friday. Please specifically ask Dr. Rennis Golden about taking your coreg. He wanted you to continue it at your last appointment, but I know you haven't been taking it. Do not take it until you talk to Dr. Rennis Golden as your heart rate is low today.    You should return to our clinic to see Dr. Chanetta Marshall in 3 months for BP.   Best,  Dr. Chanetta Marshall

## 2017-05-14 NOTE — Progress Notes (Signed)
   CC: recheck BP  HPI HTN - cards asked him to continue coreg at last visit, but he has not been taking this. Has been religiously taking entresto and hydralazine. Trying to change foods (steamed vs pan fried, increasing veggies, decreasing carbs). 3 cigarettes a day, improved from past. Drinking lots of water. Taking mens vitamin. Has decreased work to 32 hours per week. BPs have been running 127-136 systolic, he can't recall diastolic. Forgot paper readings of BPs today.   Plans to call [facility] to finish scheduling his colonoscopy, was cleared for this with minimizing IVFs per Dr. Rennis Golden.   Upon further questioning after hearing pauses on cardiac exam, patient does report that he has been feeling some skipped beats.   ROS: denies CP, SOB, abd pain, dysuria, rash, weakness.   CC, SH/smoking status, and VS noted  Objective: BP (!) 156/108   Pulse 68   Temp 98.1 F (36.7 C) (Oral)   Ht 5\' 3"  (1.6 m)   Wt 173 lb (78.5 kg)   SpO2 96%   BMI 30.65 kg/m  Gen: NAD, alert, cooperative, and pleasant. HEENT: NCAT, EOMI, PERRL CV: irregular rhythm (sounding like pauses every 3rd beat), normal rate, no murmur Resp: CTAB, no wheezes, non-labored Ext: No edema, warm Neuro: Alert and oriented, Speech clear, No gross deficits  Assessment and plan:  HTN (hypertension) Also managing with cardiology due to CHF. Would have considered restarting his coreg today (patient stopped due to confusion about cards recs, cards wanted it continued), but patient with new PACs and bradycardia to 55 today on EKG, so will defer to cards appt later this week. Of note, patient reports systolic BPs in range at home. Would consider increasing entresto if BP continues to be elevated on clinic readings and unable to tolerate coreg.   Irregular cardiac rhythm EKG performed due to hearing pauses on exam. PACs on EKG and new bradycardia. Patient denies CP or DOE, making new NSTEMI less likely. Will hold on restarting  coreg.    Orders Placed This Encounter  Procedures  . EKG 12-Lead    No orders of the defined types were placed in this encounter.  Health Maintenance reviewed - patient working on scheduling colonoscopy.  Loni Muse, MD, PGY2 05/15/2017 9:16 AM

## 2017-05-15 DIAGNOSIS — I499 Cardiac arrhythmia, unspecified: Secondary | ICD-10-CM

## 2017-05-15 HISTORY — DX: Cardiac arrhythmia, unspecified: I49.9

## 2017-05-15 NOTE — Assessment & Plan Note (Signed)
Also managing with cardiology due to CHF. Would have considered restarting his coreg today (patient stopped due to confusion about cards recs, cards wanted it continued), but patient with new PACs and bradycardia to 55 today on EKG, so will defer to cards appt later this week. Of note, patient reports systolic BPs in range at home. Would consider increasing entresto if BP continues to be elevated on clinic readings and unable to tolerate coreg.

## 2017-05-15 NOTE — Assessment & Plan Note (Signed)
EKG performed due to hearing pauses on exam. PACs on EKG and new bradycardia. Patient denies CP or DOE, making new NSTEMI less likely. Will hold on restarting coreg.

## 2017-05-17 ENCOUNTER — Ambulatory Visit: Payer: BLUE CROSS/BLUE SHIELD

## 2017-05-17 NOTE — Progress Notes (Deleted)
Patient ID: Jason Pitts                 DOB: 04-26-1961                      MRN: [id number]     HPI: Jason Pitts is a 56 y.o. male referred by Dr. Rennis Golden to pharmacist clinic. PMH includes malignant hypertension, systolic HF with EF of 30-35% (03/05/2017), and family history of CAD. During most recent office visit with cardiologist (8/30/201) his losartan was discontinued to initiate Entresto therapy. Hydralazine 25mg  twice daily was also added to therapy to improve BP control.  Patient presents today for Entresto and carvedilol titration as well as HTN follow up.    BMET today??   Current HTN meds:  Entresto 24/26 twice daily Hydralazine 25mg  twice daily Cravedilol 3.125mg  twice daily  BP goal: <130/80  Family History: CAD and HF from father  Social History: reports that he has been smoking.  He has a 12.00 pack-year smoking history. He has never used smokeless tobacco. He reports that he drinks alcohol. He reports that he does not use drugs  Diet:   Exercise:   Home BP readings:   Wt Readings from Last 3 Encounters:  05/14/17 173 lb (78.5 kg)  04/18/17 172 lb 12.8 oz (78.4 kg)  03/15/17 173 lb 12.8 oz (78.8 kg)   BP Readings from Last 3 Encounters:  05/14/17 (!) 156/108  04/18/17 (!) 180/124  03/15/17 (!) 160/108   Pulse Readings from Last 3 Encounters:  05/14/17 68  04/18/17 63  03/15/17 61    Renal function: CrCl cannot be calculated (Patient's most recent lab result is older than the maximum 21 days allowed.).  Past Medical History:  Diagnosis Date  . HTN (hypertension)   . Tobacco use     Current Outpatient Prescriptions on File Prior to Visit  Medication Sig Dispense Refill  . aspirin EC 325 MG EC tablet Take 1 tablet (325 mg total) by mouth daily. 30 tablet 0  . carvedilol (COREG) 3.125 MG tablet Take 1 tablet (3.125 mg total) by mouth 2 (two) times daily with a meal. 60 tablet 0  . hydrALAZINE (APRESOLINE) 25 MG tablet Take 25 mg by mouth 2 (two)  times daily.    . nicotine (NICODERM CQ - DOSED IN MG/24 HOURS) 14 mg/24hr patch Place 1 patch (14 mg total) onto the skin daily. 28 patch 0  . sacubitril-valsartan (ENTRESTO) 24-26 MG Take 1 tablet by mouth 2 (two) times daily. 60 tablet 5   No current facility-administered medications on file prior to visit.     No Known Allergies  There were no vitals taken for this visit.  No problem-specific Assessment & Plan notes found for this encounter.  Kindrick Lankford Rodriguez-Guzman PharmD, BCPS, CPP [facility] [address] [place] [address] 05/17/2017 8:38 AM

## 2017-06-21 ENCOUNTER — Encounter: Payer: Self-pay | Admitting: Internal Medicine

## 2017-06-21 ENCOUNTER — Ambulatory Visit (INDEPENDENT_AMBULATORY_CARE_PROVIDER_SITE_OTHER): Payer: BLUE CROSS/BLUE SHIELD | Admitting: Internal Medicine

## 2017-06-21 VITALS — BP 162/90 | HR 70 | Ht 63.0 in | Wt 171.0 lb

## 2017-06-21 DIAGNOSIS — I428 Other cardiomyopathies: Secondary | ICD-10-CM | POA: Diagnosis not present

## 2017-06-21 DIAGNOSIS — I10 Essential (primary) hypertension: Secondary | ICD-10-CM

## 2017-06-21 DIAGNOSIS — I420 Dilated cardiomyopathy: Secondary | ICD-10-CM

## 2017-06-21 DIAGNOSIS — I5043 Acute on chronic combined systolic (congestive) and diastolic (congestive) heart failure: Secondary | ICD-10-CM

## 2017-06-21 HISTORY — DX: Acute on chronic combined systolic (congestive) and diastolic (congestive) heart failure: I50.43

## 2017-06-21 MED ORDER — CARVEDILOL 3.125 MG PO TABS: 3.1250 mg | ORAL_TABLET | Freq: Two times a day (BID) | ORAL | 11 refills | Status: DC

## 2017-06-21 MED ORDER — SACUBITRIL-VALSARTAN 49-51 MG PO TABS: 1.0000 | ORAL_TABLET | Freq: Two times a day (BID) | ORAL | 11 refills | Status: DC

## 2017-06-21 MED FILL — CARVEDILOL 3.125 MG TABLET: 3.125 | 30 days supply | Qty: 60 | Fill #0

## 2017-06-21 NOTE — Progress Notes (Signed)
OFFICE FOLLOW-UP NOTE  Chief Complaint:  Follow-up heart failure  Primary Care Physician: Garth Bigness, MD  HPI:  Jason Pitts is a 56 y.o. male with a past medial history significant for chest pain, tobacco abuse, malignant hypertension, family history of premature coronary artery disease, and recent admission for chest pain. He ruled out for MI and underwent a nuclear stress test which was negative for ischemia however showed severely dilated LV with global hypokinesis and EF of 26%. Echo on 03/05/2017 showed an EF of 30-35% with global hypokinesis and a dilated aortic root of 4.3 cm. He was placed on appropriate medications including BiDil, but reported that was too expensive for him and did not give the medicine filled. Today he returns for follow-up. Blood pressure initially was elevated at 180/124. He denies headache, blurred vision, decreased urine output or chest pain.  06/21/2017  Jason Pitts returns today for follow-up of his heart failure.  Overall he seems to be doing well.  He denies any worsening shortness of breath or chest pain.  Unfortunately discontinued carvedilol for unknown reasons.  He did not do his follow-up blood work because he had a death in the family but plans to get that blood work drawn when he sees Dr. Chanetta Marshall in an upcoming visit.  He is heart rate was noted to be low in the 50s recently and they deferred restarting his beta-blocker however it is elevated again today and he would benefit from a cardiac standpoint from that.  In addition he would benefit from up titration of his Entresto.  Blood pressure today is 162/90 and at his lowest he said was about 127 systolic.  PMHx:  Past Medical History:  Diagnosis Date  . HTN (hypertension)   . Tobacco use     Past Surgical History:  Procedure Laterality Date  . MANDIBLE FRACTURE SURGERY      FAMHx:  Family History  Problem Relation Age of Onset  . Heart failure Father   . CAD Father        96's      SOCHx:   reports that he has been smoking.  He has a 12.00 pack-year smoking history. He has never used smokeless tobacco. He reports that he drinks alcohol. He reports that he does not use drugs.  ALLERGIES:  No Known Allergies  ROS: Pertinent items noted in HPI and remainder of comprehensive ROS otherwise negative.  HOME MEDS: Current Outpatient Prescriptions on File Prior to Visit  Medication Sig Dispense Refill  . aspirin EC 325 MG EC tablet Take 1 tablet (325 mg total) by mouth daily. 30 tablet 0  . carvedilol (COREG) 3.125 MG tablet Take 1 tablet (3.125 mg total) by mouth 2 (two) times daily with a meal. 60 tablet 0  . hydrALAZINE (APRESOLINE) 25 MG tablet Take 25 mg by mouth 2 (two) times daily.     No current facility-administered medications on file prior to visit.     LABS/IMAGING: No results found for this or any previous visit (from the past 48 hour(s)). No results found.  LIPID PANEL:    Component Value Date/Time   CHOL 160 03/04/2017 0853   TRIG 85 03/04/2017 0853   HDL 44 03/04/2017 0853   CHOLHDL 3.6 03/04/2017 0853   VLDL 17 03/04/2017 0853   LDLCALC 99 03/04/2017 0853     WEIGHTS: Wt Readings from Last 3 Encounters:  06/21/17 171 lb (77.6 kg)  05/14/17 173 lb (78.5 kg)  04/18/17 172 lb 12.8 oz (78.4  kg)    VITALS: BP (!) 162/90   Pulse 70   Ht 5\' 3"  (1.6 m)   Wt 171 lb (77.6 kg)   BMI 30.29 kg/m   EXAM: General appearance: alert and no distress Neck: no carotid bruit, no JVD and thyroid not enlarged, symmetric, no tenderness/mass/nodules Lungs: clear to auscultation bilaterally Heart: regular rate and rhythm, S1, S2 normal, no murmur, click, rub or gallop Abdomen: soft, non-tender; bowel sounds normal; no masses,  no organomegaly Extremities: extremities normal, atraumatic, no cyanosis or edema Pulses: 2+ and symmetric Skin: Skin color, texture, turgor normal. No rashes or lesions Neurologic: Grossly normal Psych:  Pleasant  EKG: Deferred  ASSESSMENT: 1. Uncontrolled hypertension 2. Acute systolic congestive heart failure -of EF 30-35%, dilated ventricle 3. 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 Intermediate, but acceptable risk for colonoscopy  PLAN: 1.  Jason Pitts needs to have uncontrolled hypertension.  We will restart his carvedilol and increase his Entresto to 49/51 mg twice daily.  I filled out paperwork for financial support for that today.  He will need a repeat echo next July for reassessment of LV function and his dilated aortic root.  Follow-up with me afterwards.  Chrystie NoseKenneth C. Hilty, MD, [facility]cm[facility] HospitalFACC  [place]  [facility]  Attending Cardiologist  Direct Dial: [phone]  Fax: [phone]  Website:  [email or web address]  Jason Pitts 06/21/2017, 10:08 AM

## 2017-06-21 NOTE — Patient Instructions (Signed)
Your physician has recommended you make the following change in your medication:  -- RESUME carvedilol 3.125mg  twice daily -- INCREASE entresto to 49-51mg  twice daily  Your physician has requested that you have an echocardiogram in July 2019. Echocardiography is a painless test that uses sound waves to create images of your heart. It provides your doctor with information about the size and shape of your heart and how well your heart's chambers and valves are working. This procedure takes approximately one hour. There are no restrictions for this procedure.  Your physician wants you to follow-up in: July/August 2019 with Dr. Rennis Golden after your echo. You will receive a reminder letter in the mail two months in advance. If you don't receive a letter, please call our office to schedule the follow-up appointment.

## 2017-06-24 MED FILL — hydrALAZINE HCL 25 MG TABS: 25 | 30 days supply | Qty: 60 | Fill #1

## 2017-06-25 ENCOUNTER — Other Ambulatory Visit: Payer: Self-pay

## 2017-06-25 ENCOUNTER — Ambulatory Visit (INDEPENDENT_AMBULATORY_CARE_PROVIDER_SITE_OTHER): Payer: BLUE CROSS/BLUE SHIELD | Admitting: Family Medicine

## 2017-06-25 ENCOUNTER — Encounter: Payer: Self-pay | Admitting: Family Medicine

## 2017-06-25 VITALS — BP 102/84 | HR 65 | Temp 98.2°F | Ht 63.0 in | Wt 173.4 lb

## 2017-06-25 DIAGNOSIS — Z720 Tobacco use: Secondary | ICD-10-CM | POA: Diagnosis not present

## 2017-06-25 DIAGNOSIS — I10 Essential (primary) hypertension: Secondary | ICD-10-CM

## 2017-06-25 DIAGNOSIS — Z1211 Encounter for screening for malignant neoplasm of colon: Secondary | ICD-10-CM | POA: Diagnosis not present

## 2017-06-25 NOTE — Progress Notes (Signed)
   CC: BP follow up   HPI HTN - saw Dr. Rennis Golden (cards) 11/2, who increased Entresto and added back coreg. Been changing his diet (eating beets, only baking foods, changed butter for olive oil). Has lost weight per his report (intentional). Decreased bread and carb intake. Has been drinking lots more water. No coffee anymore. Exercise - has been walking daily, new for him.   Smoking - 3 cigarettes per day, decreased from previous.   Colonoscopy - never had. Wants to try the FOBT. Admits he has been putting off colonoscopy.   ROS: denies CP, SOB, abd pain, rash, dysuria, changes in BMs.   CC, SH/smoking status, and VS noted  Objective: BP 102/84   Pulse 65   Temp 98.2 F (36.8 C) (Oral)   Ht 5\' 3"  (1.6 m)   Wt 173 lb 6.4 oz (78.7 kg)   SpO2 98%   BMI 30.72 kg/m  Gen: NAD, alert, cooperative, and pleasant. HEENT: NCAT, EOMI, PERRL CV: RRR, no murmur Resp: CTAB, no wheezes, non-labored Ext: No edema, warm Neuro: Alert and oriented, Speech clear, No gross deficits  Assessment and plan:  HTN (hypertension) Impressive improvement in BP today. Patient has been working hard on lifestyle changes, and cards recently increased entresto. Continue current meds. Filled out PCP part of Entresto discount form and faxed to company.   Tobacco abuse Congratulated on continuing to decrease smoking, encouraged continued weaning. Patient doesn't want rx help with this right now.    Orders Placed This Encounter  Procedures  . Basic metabolic panel  . Cologuard    No orders of the defined types were placed in this encounter.  Health Maintenance reviewed - cologuard rather than colonoscopy per patient choice.  Loni Muse, MD, PGY2 06/26/2017 10:53 AM

## 2017-06-25 NOTE — Patient Instructions (Signed)
It was a pleasure to see you today! Thank you for choosing [facility] for your primary care. Jaquori Hunsinger was seen for BP.   Our plans for today were:  Continue your current medicines.  Keep up the good work with your diet and exercise!   To keep you healthy, we need to monitor some screening tests. You are due for the stool screening cards.  You should return to our clinic to see Dr. Chanetta Marshall in 6 months  for BP.   Best,  Dr. Chanetta Marshall

## 2017-06-26 LAB — BASIC METABOLIC PANEL
BUN / CREAT RATIO: 18 (ref 9–20)
BUN: 18 mg/dL (ref 6–24)
CHLORIDE: 104 mmol/L (ref 96–106)
CO2: 23 mmol/L (ref 20–29)
Calcium: 9.2 mg/dL (ref 8.7–10.2)
Creatinine, Ser: 1.01 mg/dL (ref 0.76–1.27)
GFR calc Af Amer: 95 mL/min/{1.73_m2} (ref >59)
GFR, EST NON AFRICAN AMERICAN: 82 mL/min/{1.73_m2} (ref >59)
Glucose: 83 mg/dL (ref 65–99)
POTASSIUM: 4.3 mmol/L (ref 3.5–5.2)
Sodium: 142 mmol/L (ref 134–144)

## 2017-06-26 NOTE — Assessment & Plan Note (Signed)
Impressive improvement in BP today. Patient has been working hard on lifestyle changes, and cards recently increased entresto. Continue current meds. Filled out PCP part of Entresto discount form and faxed to company.

## 2017-06-26 NOTE — Assessment & Plan Note (Signed)
Congratulated on continuing to decrease smoking, encouraged continued weaning. Patient doesn't want rx help with this right now.

## 2017-07-08 MED FILL — ENTRESTO 49 MG-51 MG TABLET: 49-51 | 30 days supply | Qty: 60 | Fill #0

## 2017-07-25 LAB — COLOGUARD: COLOGUARD: NEGATIVE

## 2017-09-05 ENCOUNTER — Telehealth: Payer: Self-pay | Admitting: Internal Medicine

## 2017-09-05 NOTE — Telephone Encounter (Signed)
°  Follow Up  Requesting to speak to someone regarding pts enrollment status for Entresto. Please call.

## 2017-09-05 NOTE — Telephone Encounter (Signed)
Thayer Ohm calling to see if you received application for assistance with Entresto.Marland Kitchen

## 2017-09-05 NOTE — Telephone Encounter (Signed)
Call spoke to representative -- patient application was sent to incorrect fax.  Re-Fax to  [phone]    Representative also gave website --[email or web address] -to print information if needed.  Patient assistance will run out 09/22/17 -- information need to process by that date - or patient will not received Company  Needed page 3,and written prescription also an infromation will be mailed to patient.    Defer to dr hilty's nurse

## 2017-09-06 NOTE — Telephone Encounter (Signed)
Faxed to [employer] patient assistance application with MD portion completed to [phone]

## 2017-09-06 NOTE — Telephone Encounter (Signed)
Called mobile # - not available Called home # - wrong number

## 2017-09-12 NOTE — Telephone Encounter (Signed)
Received faxed notification from [employer] patient assistance that the patient signature was missing from application. The MD portion was completed on the application and faxed Jan 17, as the packet of paperwork stated that patient was to receive a copy of the application himself.   LM for patient that he will need to submit patient portion of application, provided [employer] patient assistance phone # [phone], or suggested he call us back with questions.

## 2017-09-17 ENCOUNTER — Ambulatory Visit: Payer: BLUE CROSS/BLUE SHIELD | Admitting: Family Medicine

## 2017-10-08 MED FILL — ENTRESTO 49 MG-51 MG TABLET: 49-51 | 30 days supply | Qty: 60 | Fill #1

## 2017-10-08 MED FILL — hydrALAZINE HCL 25 MG TABS: 25 | 30 days supply | Qty: 60 | Fill #2

## 2017-10-08 MED FILL — CARVEDILOL 3.125 MG TABLET: 3.125 | 30 days supply | Qty: 60 | Fill #1

## 2017-11-28 MED FILL — CARVEDILOL 3.125 MG TABLET: 3.125 | 30 days supply | Qty: 60 | Fill #2

## 2017-11-28 MED FILL — ENTRESTO 49 MG-51 MG TABLET: 49-51 | 30 days supply | Qty: 60 | Fill #2

## 2017-11-28 MED FILL — hydrALAZINE HCL 25 MG TABS: 25 | 30 days supply | Qty: 60 | Fill #3

## 2017-12-04 ENCOUNTER — Encounter: Payer: Self-pay | Admitting: Family Medicine

## 2017-12-04 ENCOUNTER — Ambulatory Visit (INDEPENDENT_AMBULATORY_CARE_PROVIDER_SITE_OTHER): Payer: BLUE CROSS/BLUE SHIELD | Admitting: Family Medicine

## 2017-12-04 ENCOUNTER — Other Ambulatory Visit: Payer: Self-pay

## 2017-12-04 DIAGNOSIS — I10 Essential (primary) hypertension: Secondary | ICD-10-CM

## 2017-12-04 DIAGNOSIS — G479 Sleep disorder, unspecified: Secondary | ICD-10-CM

## 2017-12-04 DIAGNOSIS — Z720 Tobacco use: Secondary | ICD-10-CM

## 2017-12-04 MED ORDER — VARENICLINE TARTRATE 0.5 MG X 11 & 1 MG X 42 PO MISC: ORAL | 0 refills | Status: DC

## 2017-12-04 MED ORDER — HYDRALAZINE HCL 25 MG PO TABS: 25.0000 mg | ORAL_TABLET | Freq: Two times a day (BID) | ORAL | 1 refills | Status: DC

## 2017-12-04 MED ORDER — ASPIRIN 325 MG PO TBEC: 325.0000 mg | DELAYED_RELEASE_TABLET | Freq: Every day | ORAL | 0 refills | Status: DC

## 2017-12-04 MED ORDER — MELATONIN 1 MG PO TABS: 0.2500 mg | ORAL_TABLET | Freq: Every day | ORAL | 2 refills | Status: DC

## 2017-12-04 NOTE — Progress Notes (Signed)
   CC: HTN   HPI   HTN - Forgot to take entresto yesterday. Most days remembers to take his BP meds. Questions whether he should do the echo recommended by cards this summer bc it costs him $100.   Social - Changing shift from 3rd at [employer] to days. Starts 4/26 with 5a-2p. Excited about this. Wants to retire at 89, plans to travel the [place] with his wife.   Smoking - currently using about 1 pack every 4 days, sharing half with his wife. This is a continued decrease from previous. Wants to try chantix, saw commercials. Tired of smoking and spending money.  Sleep- has trouble falling asleep after coming home from 3rd shift. With going back to days, he wants help with shifting sleep schedule.   Weight loss - attributes to limiting salt, changing diet for healthier foods.  Wt Readings from Last 3 Encounters:  12/04/17 167 lb 6.4 oz (75.9 kg)  06/25/17 173 lb 6.4 oz (78.7 kg)  06/21/17 171 lb (77.6 kg)   Adrianne Payne., his wife, sees Dr. Nancy Marus, and would also like Chantix. I will send her a message.    Did cologuard, negative.   ROS: Denies CP, SOB, abdominal pain, dysuria, changes in BMs.   CC, SH/smoking status, and VS noted  Objective: BP (!) 140/92   Pulse 67   Temp 98.2 F (36.8 C) (Oral)   Ht 5\' 3"  (1.6 m)   Wt 167 lb 6.4 oz (75.9 kg)   SpO2 98%   BMI 29.65 kg/m  Gen: NAD, alert, cooperative, and pleasant. HEENT: NCAT, EOMI, PERRL CV: RRR, no murmur Resp: CTAB, no wheezes, non-labored Abd: SNTND, BS present, no guarding or organomegaly Ext: No edema, warm Neuro: Alert and oriented, Speech clear, No gross deficits  Assessment and plan:  HTN (hypertension) Continue current regimen. Recheck w cards in 3 months as well. RTC 6 months with me.   Tobacco abuse Prescribed chantix starting pack. Also instructed and gave quitline resource.   Disordered sleep Shifting sleep schedule from years on 3rd shift. Counseled on how to dose melatonin ~4 hrs prior to desired bedtime  and shift by 1 hour per week until reaches normal bedtime.     Meds ordered this encounter  Medications  . varenicline (CHANTIX PAK) 0.5 MG X 11 & 1 MG X 42 tablet    Sig: Take one 0.5 mg tablet once daily for 3 days, then increase to one 0.5 mg twice daily for 4 days, then increase to one 1 mg tab twice a day.    Dispense:  53 tablet    Refill:  0  . aspirin 325 MG EC tablet    Sig: Take 1 tablet (325 mg total) by mouth daily.    Dispense:  30 tablet    Refill:  0  . hydrALAZINE (APRESOLINE) 25 MG tablet    Sig: Take 1 tablet (25 mg total) by mouth 2 (two) times daily.    Dispense:  180 tablet    Refill:  1  . Melatonin 1 MG TABS    Sig: Take 0.25 tablets (0.25 mg total) by mouth at bedtime.    Dispense:  30 tablet    Refill:  2    Health Maintenance reviewed - edited colon cancer screening as cologuard was negative.  Loni Muse, MD, PGY2 12/06/2017 8:40 AM

## 2017-12-04 NOTE — Patient Instructions (Addendum)
It was a pleasure to see you today! Thank you for choosing [facility] for your primary care. Jason Pitts was seen for blood pressure.   Our plans for today were:  Continue your medicines.  Start the Chantix medicine with your wife AFTER you finish your night shifts. Set a quit date and call Call 1800-QUIT-NOW for help with stopping smoking. Its free and they will follow up to check on you.  For your sleep, take melatonin 0.25mg  4 hours before you are trying to go to sleep, and change the time you are going to sleep every 1 week. For example, if you want to go to sleep at midnight, take at 8pm x 1 week. Then the next week, take 0.25mg  melatonin at 7pm x 1 week and aim for 11pm bedtime. You can start with any bedtime and use this formula. Avoid screens for 2 hours before bed. No caffeine after noon, and no TV in the bedroom.   You should return to our clinic to see Dr. Chanetta Marshall in 6 months for blood pressure.   Best,  Dr. Chanetta Marshall

## 2017-12-05 DIAGNOSIS — G479 Sleep disorder, unspecified: Secondary | ICD-10-CM | POA: Insufficient documentation

## 2017-12-05 NOTE — Assessment & Plan Note (Signed)
Prescribed chantix starting pack. Also instructed and gave quitline resource.

## 2017-12-05 NOTE — Assessment & Plan Note (Addendum)
Shifting sleep schedule from years on 3rd shift. Counseled on how to dose melatonin ~4 hrs prior to desired bedtime and shift by 1 hour per week until reaches normal bedtime.

## 2017-12-05 NOTE — Assessment & Plan Note (Signed)
Continue current regimen. Recheck w cards in 3 months as well. RTC 6 months with me.

## 2018-01-06 ENCOUNTER — Encounter: Payer: Self-pay | Admitting: Internal Medicine

## 2018-01-06 ENCOUNTER — Ambulatory Visit (INDEPENDENT_AMBULATORY_CARE_PROVIDER_SITE_OTHER): Payer: BLUE CROSS/BLUE SHIELD | Admitting: Internal Medicine

## 2018-01-06 VITALS — BP 145/80 | HR 71 | Temp 99.0°F | Wt 162.6 lb

## 2018-01-06 DIAGNOSIS — R300 Dysuria: Secondary | ICD-10-CM | POA: Diagnosis not present

## 2018-01-06 DIAGNOSIS — R319 Hematuria, unspecified: Secondary | ICD-10-CM

## 2018-01-06 HISTORY — DX: Dysuria: R30.0

## 2018-01-06 LAB — POCT URINALYSIS DIP (MANUAL ENTRY)
BILIRUBIN UA: NEGATIVE
GLUCOSE UA: NEGATIVE mg/dL
Nitrite, UA: POSITIVE — AB
Protein Ur, POC: 100 mg/dL — AB
SPEC GRAV UA: 1.02 (ref 1.010–1.025)
Urobilinogen, UA: 0.2 E.U./dL
pH, UA: 6 (ref 5.0–8.0)

## 2018-01-06 LAB — POCT UA - MICROSCOPIC ONLY

## 2018-01-06 MED ORDER — CIPROFLOXACIN HCL 500 MG PO TABS: 500.0000 mg | ORAL_TABLET | Freq: Two times a day (BID) | ORAL | 0 refills | Status: DC

## 2018-01-06 MED FILL — CIPROFLOXACIN HCL 500 MG TA: 500 | 7 days supply | Qty: 14 | Fill #0

## 2018-01-06 NOTE — Progress Notes (Signed)
URI.

## 2018-01-06 NOTE — Progress Notes (Signed)
[facility] Indigent Fund Voucher:  Previous assistance?  No  Taxi, Bus pass, gas card, food, or other? Gas card  Med co-pay? Yes ($4.25)         [facility] Indigent Fund?  No, unable to use this fund due to has insurance Other:  Patient paid for med out of pocket to outpatient pharmacy.  Will reimburse patient $4.25 and provide $5 [employer] gas card since he had already left office and was experiencing financial hardship.  Provider:  [facility] Staff:  Altamese Dilling, MSN, RN-BC  Note routed to Altamese Dilling, RN and Sammuel Hines, LCSW to add to [employer].

## 2018-01-06 NOTE — Patient Instructions (Signed)
Mr. Inghram,  Please take ciprofloxacin 500 mg every 12 hours for the next 7 days. I will call if urine culture grows a bacteria that does not respond to this medication.  If you have nausea, vomiting, flank pain, weakness, or fever (100.4 F or above) despite medication, please go to emergency room.   Please return in 1 month for urine recheck.  I recommend miralax daily, increasing as needed to have soft bowel movement daily.  Best, Dr. Sampson Goon

## 2018-01-06 NOTE — Progress Notes (Signed)
[facility] Progress Note  Subjective:  Jason Pitts is a 57 y.o. male with history of HFrEF (EF 30%-35%) with dilated cardiomyopathy, HTN, and tobacco abuse who presents for blood in urine. He reports noting pink urine since Saturday. He denies history of this or kidney stones. He has had increased urinary frequency, though he reports drinking more. He also notes a sharp tingle/burning after urination. He slept all day yesterday and had subjective fever and chills after working outside at [facility] Sunday. He denies pus coming from penis or concern for STDs (only sexually active with his wife). He thinks he may have an enlarged prostate because he urinates about 2 times overnight typically though he denies trouble with his stream. No family history of bladder cancer. His sister had breast cancer with mastectomy and is doing well. ROS: No back pain, no groin pain, no n/v/d; complains of constipation  No Known Allergies  Social History   Tobacco Use  . Smoking status: Current Some Day Smoker    Packs/day: 0.30    Years: 40.00    Pack years: 12.00  . Smokeless tobacco: Never Used  Substance Use Topics  . Alcohol use: Yes    Objective: Blood pressure (!) 145/80, pulse 71, temperature 99 F (37.2 C), temperature source Oral, weight 162 lb 9.6 oz (73.8 kg), SpO2 99 %. Body mass index is 28.8 kg/m. Constitutional: Well-appearing male in NAD HENT: MMM Cardiovascular: RRR, S1, S2, no m/r/g.  Pulmonary/Chest: Effort normal and breath sounds normal.  Abdominal: Soft. +BS, NT Musculoskeletal: No LE edema, no CVA tenderness to percussion GU: Chaperone present. Some stool in rectal vault. Prostate appreciated and did not feel enlarged; was non-tender.  Neurological: AOx3, no focal deficits. Skin: Skin is warm and dry. No rash noted.  Psychiatric: Normal mood and affect.  Vitals reviewed  Recent Results (from the past 2160 hour(s))  POCT urinalysis dipstick     Status: Abnormal    Collection Time: 01/06/18  2:31 PM  Result Value Ref Range   Color, UA yellow yellow   Clarity, UA cloudy (A) clear   Glucose, UA negative negative mg/dL   Bilirubin, UA negative negative   Ketones, POC UA small (15) (A) negative mg/dL   Spec Grav, UA 6.063 0.160 - 1.025   Blood, UA moderate (A) negative   pH, UA 6.0 5.0 - 8.0   Protein Ur, POC =100 (A) negative mg/dL   Urobilinogen, UA 0.2 0.2 or 1.0 E.U./dL   Nitrite, UA Positive (A) Negative   Leukocytes, UA Large (3+) (A) Negative  POCT UA - Microscopic Only     Status: Abnormal   Collection Time: 01/06/18  2:31 PM  Result Value Ref Range   WBC, Ur, HPF, POC TOO MANY TO COUNT    RBC, urine, microscopic 5-10    Bacteria, U Microscopic MODERATE    Epithelial cells, urine per micros 1-5    Assessment/Plan: Dysuria - With gross hematuria. UA and micro consistent with UTI with nitrites and leukocytes. No groin or abdominal pain to suggest nephrolithiasis. No CVA tenderness to suggest pyelonephritis.  - Will treat as complicated UTI with ciprofloxacin 500 mg BID x 7 days. No prostate tenderness to suggest longer course needed. Precepted with Dr. Sheffield Slider. Patient could not afford medication but able to provide through [facility] indigent fund. - Urine culture ordered. - Constipation could be a contributor to UTI; patient to take miralax or milk of magnesia.  - Gave strict return precautions of nausea, vomiting, diarrhea,  low BP, increased weakness, back pain as potential signs of worsening infection or spread.  - Will need repeat UA in 2-4 weeks to ensure resolution of hematuria after treatment of UTI. Dr. Sheffield Slider recommended repeating x 2. Risk of cancer increased due to smoking history.  Follow-up in 2-4 weeks for repeat UA to reassess for hematuria.   Dani Gobble, MD [facility], PGY-3

## 2018-01-06 NOTE — Assessment & Plan Note (Addendum)
-   With gross hematuria. UA and micro consistent with UTI with nitrites and leukocytes. No groin or abdominal pain to suggest nephrolithiasis. No CVA tenderness to suggest pyelonephritis.  - Will treat as complicated UTI with ciprofloxacin 500 mg BID x 7 days. No prostate tenderness to suggest longer course needed. Precepted with Dr. Sheffield Slider. Patient could not afford medication but able to provide through [facility] indigent fund. - Urine culture ordered. - Constipation could be a contributor to UTI; patient to take miralax or milk of magnesia.  - Gave strict return precautions of nausea, vomiting, diarrhea, low BP, increased weakness, back pain as potential signs of worsening infection or spread.  - Will need repeat UA in 2-4 weeks to ensure resolution of hematuria after treatment of UTI. Dr. Sheffield Slider recommended repeating x 2. Risk of cancer increased due to smoking history.

## 2018-01-08 LAB — URINE CULTURE

## 2018-02-24 ENCOUNTER — Ambulatory Visit (HOSPITAL_COMMUNITY): Payer: BLUE CROSS/BLUE SHIELD | Attending: Cardiology

## 2018-03-07 ENCOUNTER — Ambulatory Visit (HOSPITAL_COMMUNITY): Payer: BLUE CROSS/BLUE SHIELD | Attending: Cardiology

## 2018-03-12 ENCOUNTER — Encounter (HOSPITAL_COMMUNITY): Payer: Self-pay | Admitting: Radiology

## 2018-04-01 MED FILL — hydrALAZINE HCL 25 MG TABS: 25 | 30 days supply | Qty: 60 | Fill #4

## 2018-04-01 MED FILL — CARVEDILOL 3.125 MG TABLET: 3.125 | 30 days supply | Qty: 60 | Fill #3

## 2018-04-09 MED FILL — ENTRESTO 49 MG-51 MG TABLET: 49-51 | 30 days supply | Qty: 60 | Fill #3

## 2018-04-25 ENCOUNTER — Encounter: Payer: Self-pay | Admitting: Family Medicine

## 2018-04-25 NOTE — Progress Notes (Signed)
Patient's wife in yesterday for her appointment.  She states he has had significant memory changes over the past month or so that are all new.  She says he does things that are very abnormal for him such as putting empty plates into the fridge, she notes he is also much more easily angered than previous.  She does state that she is physically safe and he has not been harmful to her.  She notes a financial indiscretion with buying a car from an acquaintance, but not obtaining any paperwork for this.  And now they do not have the car.  She repeats the word dementia and wonders whether he needs head imaging.  She is hesitant to discuss this with him because she feels he will be easily angered.  He has an appointment with me upcoming this month.  Discussed with Dr. Deirdre Priest, given subacute onset will pursue full dementia work-up at that appointment with head imaging including MRI.  Labs should include RPR, B12, folate, CMAC, CBC, TSH.

## 2018-05-09 ENCOUNTER — Ambulatory Visit: Payer: BLUE CROSS/BLUE SHIELD | Admitting: Family Medicine

## 2018-05-15 ENCOUNTER — Encounter: Payer: Self-pay | Admitting: Internal Medicine

## 2018-05-15 ENCOUNTER — Ambulatory Visit (INDEPENDENT_AMBULATORY_CARE_PROVIDER_SITE_OTHER): Payer: BLUE CROSS/BLUE SHIELD | Admitting: Internal Medicine

## 2018-05-15 VITALS — BP 145/93 | HR 69 | Ht 62.0 in | Wt 166.4 lb

## 2018-05-15 DIAGNOSIS — I5022 Chronic systolic (congestive) heart failure: Secondary | ICD-10-CM

## 2018-05-15 DIAGNOSIS — I428 Other cardiomyopathies: Secondary | ICD-10-CM | POA: Diagnosis not present

## 2018-05-15 DIAGNOSIS — I77810 Thoracic aortic ectasia: Secondary | ICD-10-CM

## 2018-05-15 DIAGNOSIS — I420 Dilated cardiomyopathy: Secondary | ICD-10-CM | POA: Diagnosis not present

## 2018-05-15 MED ORDER — SACUBITRIL-VALSARTAN 97-103 MG PO TABS: 1.0000 | ORAL_TABLET | Freq: Two times a day (BID) | ORAL | 11 refills | Status: DC

## 2018-05-15 MED ORDER — ASPIRIN EC 81 MG PO TBEC: 81.0000 mg | DELAYED_RELEASE_TABLET | Freq: Every day | ORAL | 3 refills | Status: AC

## 2018-05-15 MED FILL — ENTRESTO 97 MG-103 MG TAB: 97-103 | 30 days supply | Qty: 60 | Fill #0

## 2018-05-15 NOTE — Patient Instructions (Signed)
Medication Instructions:   INCREASE entresto to 97/103mg  twice daily -- start new prescription when you finish 49/51mg  tablets  Labwork:  NONE  Testing/Procedures:  Your physician has requested that you have an echocardiogram. Echocardiography is a painless test that uses sound waves to create images of your heart. It provides your doctor with information about the size and shape of your heart and how well your heart's chambers and valves are working. This procedure takes approximately one hour. There are no restrictions for this procedure. -- done at [address]rd [address]  Follow-Up:  Your physician wants you to follow-up in: 6 months with Dr. Rennis Golden. You will receive a reminder letter in the mail two months in advance. If you don't receive a letter, please call our office to schedule the follow-up appointment.   If you need a refill on your cardiac medications before your next appointment, please call your pharmacy.  Any Other Special Instructions Will Be Listed Below (If Applicable).

## 2018-05-15 NOTE — Progress Notes (Signed)
OFFICE FOLLOW-UP NOTE  Chief Complaint:  Follow-up heart failure  Primary Care Physician: Jason Bigness, MD  HPI:  Jason Pitts is a 57 y.o. male with a past medial history significant for chest pain, tobacco abuse, malignant hypertension, family history of premature coronary artery disease, and recent admission for chest pain. He ruled out for MI and underwent a nuclear stress test which was negative for ischemia however showed severely dilated LV with global hypokinesis and EF of 26%. Echo on 03/05/2017 showed an EF of 30-35% with global hypokinesis and a dilated aortic root of 4.3 cm. He was placed on appropriate medications including BiDil, but reported that was too expensive for him and did not give the medicine filled. Today he returns for follow-up. Blood pressure initially was elevated at 180/124. He denies headache, blurred vision, decreased urine output or chest pain.  06/21/2017  [name] returns today for follow-up of his heart failure.  Overall he seems to be doing well.  He denies any worsening shortness of breath or chest pain.  Unfortunately discontinued carvedilol for unknown reasons.  He did not do his follow-up blood work because he had a death in the family but plans to get that blood work drawn when he sees Dr. Chanetta Marshall in an upcoming visit.  He is heart rate was noted to be low in the 50s recently and they deferred restarting his beta-blocker however it is elevated again today and he would benefit from a cardiac standpoint from that.  In addition he would benefit from up titration of his Entresto.  Blood pressure today is 162/90 and at his lowest he said was about 127 systolic.  05/15/2018  [name] is seen today for follow-up heart failure.  Overall he endorses NYHA class I to maximally class II symptoms.  He is able to continue to work without any issues.  He denies chest pain or worsening shortness of breath.  His blood pressure has remained elevated and was  145/93 today.  He is on the moderate dose of Entresto.  He was recommended to have a repeat echo however that has not yet been performed.  PMHx:  Past Medical History:  Diagnosis Date  . HTN (hypertension)   . Tobacco use     Past Surgical History:  Procedure Laterality Date  . MANDIBLE FRACTURE SURGERY      FAMHx:  Family History  Problem Relation Age of Onset  . Heart failure Father   . CAD Father        66's    SOCHx:   reports that he has been smoking. He has a 12.00 pack-year smoking history. He has never used smokeless tobacco. He reports that he drinks alcohol. He reports that he does not use drugs.  ALLERGIES:  No Known Allergies  ROS: Pertinent items noted in HPI and remainder of comprehensive ROS otherwise negative.  HOME MEDS: Current Outpatient Medications on File Prior to Visit  Medication Sig Dispense Refill  . aspirin 325 MG EC tablet Take 1 tablet (325 mg total) by mouth daily. 30 tablet 0  . carvedilol (COREG) 3.125 MG tablet Take 1 tablet (3.125 mg total) by mouth 2 (two) times daily with a meal. 60 tablet 11  . ciprofloxacin (CIPRO) 500 MG tablet Take 1 tablet (500 mg total) by mouth 2 (two) times daily. 14 tablet 0  . hydrALAZINE (APRESOLINE) 25 MG tablet Take 1 tablet (25 mg total) by mouth 2 (two) times daily. 180 tablet 1  . Melatonin 1  MG TABS Take 0.25 tablets (0.25 mg total) by mouth at bedtime. 30 tablet 2  . sacubitril-valsartan (ENTRESTO) 49-51 MG Take 1 tablet by mouth 2 (two) times daily. 60 tablet 11  . varenicline (CHANTIX PAK) 0.5 MG X 11 & 1 MG X 42 tablet Take one 0.5 mg tablet once daily for 3 days, then increase to one 0.5 mg twice daily for 4 days, then increase to one 1 mg tab twice a day. 53 tablet 0   No current facility-administered medications on file prior to visit.     LABS/IMAGING: No results found for this or any previous visit (from the past 48 hour(s)). No results found.  LIPID PANEL:    Component Value Date/Time    CHOL 160 03/04/2017 0853   TRIG 85 03/04/2017 0853   HDL 44 03/04/2017 0853   CHOLHDL 3.6 03/04/2017 0853   VLDL 17 03/04/2017 0853   LDLCALC 99 03/04/2017 0853     WEIGHTS: Wt Readings from Last 3 Encounters:  05/15/18 166 lb 6.4 oz (75.5 kg)  01/06/18 162 lb 9.6 oz (73.8 kg)  12/04/17 167 lb 6.4 oz (75.9 kg)    VITALS: BP (!) 145/93   Pulse 69   Ht 5\' 2"  (1.575 m)   Wt 166 lb 6.4 oz (75.5 kg)   BMI 30.43 kg/m   EXAM: General appearance: alert and no distress Neck: no carotid bruit, no JVD and thyroid not enlarged, symmetric, no tenderness/mass/nodules Lungs: clear to auscultation bilaterally Heart: regular rate and rhythm, S1, S2 normal, no murmur, click, rub or gallop Abdomen: soft, non-tender; bowel sounds normal; no masses,  no organomegaly Extremities: extremities normal, atraumatic, no cyanosis or edema Pulses: 2+ and symmetric Skin: Skin color, texture, turgor normal. No rashes or lesions Neurologic: Grossly normal Psych: Pleasant  EKG: Sinus rhythm at 69, LVH with lateral T wave inversions-personally reviewed  ASSESSMENT: 1. Uncontrolled hypertension 2. Acute systolic congestive heart failure -of EF 30-35%, dilated ventricle 3. 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 Intermediate, but acceptable risk for colonoscopy  PLAN: 1.  [name] seems to be doing well with NYHA class I-II symptoms.  He has a history of dilated cardiomyopathy with EF 30 to 35%.  There is blood pressure room to increase his Entresto further and will push that dose up to the full dose today.  He also has a dilated aortic root which will need to be reassessed.  We have ordered an echocardiogram which I like to obtain and will contact him with that information.  Plan follow-up with me in 6 months or sooner as necessary.  Jason Nose, MD, [facility], FACP  [place]  [facility]  Medical Director of the [facility] Lipid [facility] &  Cardiovascular Risk Reduction Clinic Diplomate of the American  Board of Clinical Lipidology Attending Cardiologist  Direct Dial: [phone]  Fax: [phone]  Website:  [email or web address] Nicely Edan Serratore 05/15/2018, 3:19 PM

## 2018-05-23 ENCOUNTER — Ambulatory Visit (HOSPITAL_COMMUNITY): Payer: BLUE CROSS/BLUE SHIELD

## 2018-06-06 ENCOUNTER — Other Ambulatory Visit (HOSPITAL_COMMUNITY): Payer: BLUE CROSS/BLUE SHIELD

## 2018-06-24 ENCOUNTER — Other Ambulatory Visit: Payer: Self-pay | Admitting: Internal Medicine

## 2018-06-24 NOTE — Telephone Encounter (Signed)
Rx request sent to pharmacy.  

## 2018-07-02 ENCOUNTER — Telehealth: Payer: Self-pay

## 2018-07-02 NOTE — Telephone Encounter (Signed)
FORWARD MESSAGE 

## 2018-07-02 NOTE — Telephone Encounter (Signed)
New message    Just an FYI. We have made several attempts to contact this patient including sending a letter to schedule or reschedule their echocardiogram. We will be removing the patient from the echo WQ.   Thank you 

## 2018-07-22 ENCOUNTER — Telehealth: Payer: Self-pay | Admitting: *Deleted

## 2018-07-22 ENCOUNTER — Emergency Department (HOSPITAL_COMMUNITY)
Admission: EM | Admit: 2018-07-22 | Discharge: 2018-07-22 | Disposition: A | Discharge status: Home / Self Care | Payer: BLUE CROSS/BLUE SHIELD | Attending: Emergency Medicine | Admitting: Emergency Medicine

## 2018-07-22 ENCOUNTER — Emergency Department (HOSPITAL_COMMUNITY): Payer: BLUE CROSS/BLUE SHIELD

## 2018-07-22 ENCOUNTER — Encounter (HOSPITAL_COMMUNITY): Payer: Self-pay | Admitting: Emergency Medicine

## 2018-07-22 DIAGNOSIS — I428 Other cardiomyopathies: Secondary | ICD-10-CM | POA: Diagnosis not present

## 2018-07-22 DIAGNOSIS — F4321 Adjustment disorder with depressed mood: Secondary | ICD-10-CM

## 2018-07-22 DIAGNOSIS — R079 Chest pain, unspecified: Secondary | ICD-10-CM | POA: Diagnosis not present

## 2018-07-22 DIAGNOSIS — F4320 Adjustment disorder, unspecified: Secondary | ICD-10-CM | POA: Diagnosis not present

## 2018-07-22 DIAGNOSIS — F17200 Nicotine dependence, unspecified, uncomplicated: Secondary | ICD-10-CM | POA: Insufficient documentation

## 2018-07-22 DIAGNOSIS — I5042 Chronic combined systolic (congestive) and diastolic (congestive) heart failure: Secondary | ICD-10-CM | POA: Insufficient documentation

## 2018-07-22 DIAGNOSIS — I110 Hypertensive heart disease with heart failure: Secondary | ICD-10-CM | POA: Diagnosis not present

## 2018-07-22 DIAGNOSIS — Z7982 Long term (current) use of aspirin: Secondary | ICD-10-CM | POA: Diagnosis not present

## 2018-07-22 DIAGNOSIS — Z79899 Other long term (current) drug therapy: Secondary | ICD-10-CM | POA: Diagnosis not present

## 2018-07-22 LAB — CBC
HCT: 40.5 % (ref 39.0–52.0)
Hemoglobin: 12.2 g/dL — ABNORMAL LOW (ref 13.0–17.0)
MCH: 23.6 pg — ABNORMAL LOW (ref 26.0–34.0)
MCHC: 30.1 g/dL (ref 30.0–36.0)
MCV: 78.5 fL — ABNORMAL LOW (ref 80.0–100.0)
Platelets: 241 10*3/uL (ref 150–400)
RBC: 5.16 MIL/uL (ref 4.22–5.81)
RDW: 15.6 % — ABNORMAL HIGH (ref 11.5–15.5)
WBC: 6.4 10*3/uL (ref 4.0–10.5)
nRBC: 0 % (ref 0.0–0.2)

## 2018-07-22 LAB — BASIC METABOLIC PANEL
Anion gap: 10 (ref 5–15)
BUN: 19 mg/dL (ref 6–20)
CO2: 23 mmol/L (ref 22–32)
Calcium: 9.2 mg/dL (ref 8.9–10.3)
Chloride: 104 mmol/L (ref 98–111)
Creatinine, Ser: 1.25 mg/dL — ABNORMAL HIGH (ref 0.61–1.24)
GFR calc non Af Amer: >60 mL/min (ref >60)
Glucose, Bld: 91 mg/dL (ref 70–99)
Potassium: 4 mmol/L (ref 3.5–5.1)
Sodium: 137 mmol/L (ref 135–145)

## 2018-07-22 LAB — I-STAT TROPONIN, ED
Troponin i, poc: 0.02 ng/mL (ref 0.00–0.08)
Troponin i, poc: 0.01 ng/mL (ref 0.00–0.08)

## 2018-07-22 MED ORDER — LORAZEPAM 1 MG PO TABS
0.5000 mg | ORAL_TABLET | Freq: Once | ORAL | Status: AC
  Administered 2018-07-22: 0.5 mg via ORAL
  Filled 2018-07-22: qty 1

## 2018-07-22 MED FILL — CARVEDILOL 3.125 MG TABLET: 3.125 | 30 days supply | Qty: 60 | Fill #0

## 2018-07-22 MED FILL — hydrALAZINE HCL 25 MG TABS: 25 | 30 days supply | Qty: 60 | Fill #0

## 2018-07-22 MED FILL — ENTRESTO 97 MG-103 MG TAB: 97-103 | 30 days supply | Qty: 60 | Fill #1

## 2018-07-22 NOTE — Discharge Instructions (Signed)
You were given narcotic and or sedative medications while in the emergency department. Do not drive. Do not use machinery or power tools. Do not sign legal documents. Do not drink alcohol. Do not take sleeping pills. Do not supervise children by yourself. Do not participate in activities that require climbing or being in high places.  Your caregiver has diagnosed you as having chest pain that is not specific for one problem, but does not require admission.  You are at low risk for an acute heart condition or other serious illness. Chest pain comes from many different causes.  SEEK IMMEDIATE MEDICAL ATTENTION IF: You have severe chest pain, especially if the pain is crushing or pressure-like and spreads to the arms, back, neck, or jaw, or if you have sweating, nausea (feeling sick to your stomach), or shortness of breath. THIS IS AN EMERGENCY. Don't wait to see if the pain will go away. Get medical help at once. Call 911 or 0 (operator). DO NOT drive yourself to the hospital.  Your chest pain gets worse and does not go away with rest.  You have an attack of chest pain lasting longer than usual, despite rest and treatment with the medications your caregiver has prescribed.  You wake from sleep with chest pain or shortness of breath.  You feel dizzy or faint.  You have chest pain not typical of your usual pain for which you originally saw your caregiver.

## 2018-07-22 NOTE — ED Provider Notes (Signed)
[facility] EMERGENCY DEPARTMENT Provider Note   CSN: [id number] Arrival date & time: 07/22/18  1037     History   Chief Complaint Chief Complaint  Patient presents with  . Chest Pain    HPI Jason Pitts is a 57 y.o. male   The history is provided by the patient.  Chest Pain   This is a new problem. The current episode started 1 to 2 hours ago. The problem occurs constantly. The problem has not changed since onset.The pain is associated with rest. The pain is present in the substernal region. The pain is at a severity of 6/10. The pain is moderate. The quality of the pain is described as pressure-like and heavy. The pain does not radiate. Duration of episode(s) is 3 hours. Pertinent negatives include no abdominal pain, no back pain, no claudication, no cough, no diaphoresis, no dizziness, no exertional chest pressure, no fever, no headaches, no hemoptysis, no irregular heartbeat, no leg pain, no lower extremity edema, no malaise/fatigue, no nausea, no near-syncope, no numbness, no orthopnea, no palpitations, no PND, no shortness of breath, no sputum production, no syncope, no vomiting and no weakness. He has tried nitroglycerin (asa) for the symptoms. The treatment provided no relief.     This is a 57 year old male who has a past medical history of nonischemic cardiomyopathy, dilated aortic root, acute on chronic combined systolic and diastolic heart failure, hypertension, family history of early CAD and tobacco abuse.  The patient presents the emergency department with chief complaint of chest pain.  The patient found out this morning that his 31 year old father passed away.  He is clearly overwhelmed with grief during the HPI.  After the call he began having chest pain.  He describes the pain as heavy, constant and retrosternal.  He denies any radiation, shortness of breath other than what he is experiencing with heavy sobbing.  He recessed, nausea, vomiting. Past  Medical History:  Diagnosis Date  . HTN (hypertension)   . Tobacco use     Patient Active Problem List   Diagnosis Date Noted  . Dilated aortic root (HCC) 05/15/2018  . Dysuria 01/06/2018  . Disordered sleep 12/05/2017  . Acute on chronic combined systolic and diastolic CHF (congestive heart failure) (HCC) 06/21/2017  . Irregular cardiac rhythm 05/15/2017  . Preoperative cardiovascular examination 04/20/2017  . Nonischemic cardiomyopathy (HCC) 03/05/2017  . 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 Chest pain 03/04/2017  . Family history of early CAD   . HTN (hypertension) 02/15/2017  . Tobacco abuse 02/15/2017    Past Surgical History:  Procedure Laterality Date  . MANDIBLE FRACTURE SURGERY          Home Medications    Prior to Admission medications   Medication Sig Start Date End Date Taking? Authorizing Provider  aspirin EC 81 MG tablet Take 1 tablet (81 mg total) by mouth daily. 05/15/18  Yes Hilty, Lisette Abu, MD  carvedilol (COREG) 3.125 MG tablet TAKE ONE TABLET BY MOUTH TWICE DAILY WITH A MEAL Patient taking differently: Take 3.125 mg by mouth 2 (two) times daily with a meal.  06/24/18  Yes Hilty, Lisette Abu, MD  hydrALAZINE (APRESOLINE) 25 MG tablet Take 1 tablet (25 mg total) by mouth 2 (two) times daily. 12/04/17  Yes Garth Bigness, MD  Melatonin 1 MG TABS Take 0.25 tablets (0.25 mg total) by mouth at bedtime. Patient taking differently: Take 0.25 mg by mouth as needed.  12/04/17  Yes Garth Bigness, MD  sacubitril-valsartan [facility]) [phone]  MG Take 1 tablet by mouth 2 (two) times daily. 05/15/18  Yes Hilty, Lisette Abu, MD  ciprofloxacin (CIPRO) 500 MG tablet Take 1 tablet (500 mg total) by mouth 2 (two) times daily. Patient not taking: Reported on 07/22/2018 01/06/18   Casey Burkitt, MD  varenicline (CHANTIX PAK) 0.5 MG X 11 & 1 MG X 42 tablet Take one 0.5 mg tablet once daily for 3 days, then increase to one 0.5 mg twice daily for 4 days, then increase  to one 1 mg tab twice a day. Patient not taking: Reported on 07/22/2018 12/04/17   Garth Bigness, MD    Family History Family History  Problem Relation Age of Onset  . Heart failure Father   . CAD Father        97's    Social History Social History   Tobacco Use  . Smoking status: Current Some Day Smoker    Packs/day: 0.30    Years: 40.00    Pack years: 12.00  . Smokeless tobacco: Never Used  Substance Use Topics  . Alcohol use: Yes  . Drug use: No     Allergies   Patient has no known allergies.   Review of Systems Review of Systems  Constitutional: Negative for diaphoresis, fever and malaise/fatigue.  Respiratory: Negative for cough, hemoptysis, sputum production and shortness of breath.   Cardiovascular: Positive for chest pain. Negative for palpitations, orthopnea, claudication, syncope, PND and near-syncope.  Gastrointestinal: Negative for abdominal pain, nausea and vomiting.  Musculoskeletal: Negative for back pain.  Neurological: Negative for dizziness, weakness, numbness and headaches.  All other systems reviewed and are negative.    Physical Exam Updated Vital Signs BP (!) 166/107 (BP Location: Right Arm)   Pulse (!) 54   Temp 98.7 F (37.1 C) (Oral)   Resp 12   Ht 5\' 2"  (1.575 m)   Wt 78 kg   SpO2 96%   BMI 31.46 kg/m   Physical Exam  Constitutional: He appears well-developed and well-nourished. No distress.  Tearful, sobbing heavily  HENT:  Head: Normocephalic and atraumatic.  Eyes: Conjunctivae are normal. No scleral icterus.  Neck: Normal range of motion. Neck supple.  Cardiovascular: Normal rate, regular rhythm and normal heart sounds.  Pulmonary/Chest: Effort normal and breath sounds normal. No respiratory distress.  Abdominal: Soft. There is no tenderness.  Musculoskeletal: He exhibits no edema.  Neurological: He is alert.  Skin: Skin is warm and dry. He is not diaphoretic.  Psychiatric: His behavior is normal.  Nursing note and  vitals reviewed.    ED Treatments / Results  Labs (all labs ordered are listed, but only abnormal results are displayed) Labs Reviewed  BASIC METABOLIC PANEL - Abnormal; Notable for the following components:      Result Value   Creatinine, Ser 1.25 (*)    All other components within normal limits  CBC - Abnormal; Notable for the following components:   Hemoglobin 12.2 (*)    MCV 78.5 (*)    MCH 23.6 (*)    RDW 15.6 (*)    All other components within normal limits  I-STAT TROPONIN, ED    EKG EKG Interpretation  Date/Time:  Tuesday July 22 2018 10:48:46 EST Ventricular Rate:  69 PR Interval:  196 QRS Duration: 102 QT Interval:  412 QTC Calculation: 441 R Axis:   -6 Text Interpretation:  Normal sinus rhythm Minimal voltage criteria for LVH, may be normal variant Septal infarct , age undetermined ST & T wave abnormality, consider  inferolateral ischemia Abnormal ECG when compared to prior, similar t wave inversions in 2,3,AVF, V5 and V6. T wave now upright in lead V4.  No STEMI Confirmed by Theda Belfast ([id number]) on 07/22/2018 10:53:01 AM Also confirmed by Theda Belfast ([id number]), editor Barbette Hair [phone])  on 07/22/2018 10:55:14 AM   Radiology Dg Chest 2 View  Result Date: 07/22/2018 CLINICAL DATA:  Chest pain EXAM: CHEST - 2 VIEW COMPARISON:  03/04/17 FINDINGS: Cardiac shadow is mildly enlarged but stable. The lungs are well aerated bilaterally. No focal infiltrate or sizable effusion is seen. No acute bony abnormality is noted. IMPRESSION: No acute abnormality noted. Electronically Signed   By: Alcide Clever M.D.   On: 07/22/2018 11:25    Procedures Procedures (including critical care time)  Medications Ordered in ED Medications  LORazepam (ATIVAN) tablet 0.5 mg (0.5 mg Oral Given 07/22/18 1133)     Initial Impression / Assessment and Plan / ED Course  I have reviewed the triage vital signs and the nursing notes.  Pertinent labs & imaging results that were available  during my care of the patient were reviewed by me and considered in my medical decision making (see chart for details).     This is a 57 year old male who presents with chest pain after finding out his father died earlier today.  He does have a moderate risk heart score however the patient has 2- troponins here in the emergency department.  I think that his pain is secondary to anxiety and grief.  His EKG is unchanged from previous.  He has resolution of his symptoms during his visit here.  Patient was seen and shared visit with Dr. Rush Landmark who agrees with work-up and plan for discharge at this time.  Discussed close follow-up with his cardiologist.  Patient also understands return precautions.  Final Clinical Impressions(s) / ED Diagnoses   Final diagnoses:  None    ED Discharge Orders    None       Arthor Captain, PA-C 07/22/18 1633    Tegeler, Canary Brim, MD 07/22/18 Barry Brunner

## 2018-07-22 NOTE — ED Triage Notes (Signed)
Pt here from work when he got a phone call that his dad had passed away and he began to have chest pain , pt received 324 mg of asa and 2 nitro which helped his pain

## 2018-07-22 NOTE — Telephone Encounter (Signed)
Pt states that he missed two calls from Dr. Chanetta Marshall.  I do not see any phone messages.  Will forward to MD. Jalea Bronaugh, Maryjo Rochester, CMA

## 2018-07-22 NOTE — ED Notes (Signed)
Patient verbalizes understanding of discharge instructions. Opportunity for questioning and answers were provided. Pt discharged from ED. 

## 2018-07-23 NOTE — Telephone Encounter (Signed)
I did not call him, but I have discussed multiple times with his wife at her visits that I would like to see him. She reported that he seems to have memory troubles more than normal lately (don't tell the patient that she reported this). Could we call him back and try to have him seen with my sometime this month?

## 2018-07-25 NOTE — Telephone Encounter (Signed)
Informed pt of below and scheduled an appointment for 09/10/2018 with PCP. Lamonte Sakai, Tameem Pullara D, [name]

## 2018-08-15 ENCOUNTER — Telehealth: Payer: Self-pay | Admitting: Family Medicine

## 2018-08-15 NOTE — Telephone Encounter (Signed)
Pt would like to have Dr. Chanetta Marshall to write a note for his employer. He needs it to explain with his health conditions and congestive heart failure that he needs some restrictions. He has issues with SOB when pushing carts.   Pt would like to have this left at the front desk to be picked up.  Pt would also like Dr. Chanetta Marshall to call him.

## 2018-08-16 NOTE — Telephone Encounter (Signed)
It appears that the patient has no showed multiple appointments in our office and with cardiology, and it is important for him to be seen for workup of possible dementia. I am covering Dr. Christena Flake inbox while she is away. I think a reasonable plan would be for him to follow up in the office at his next appointment and if appropriate she will be able to generate a letter for him at that time. I will route a copy of this plan for her to review upon her return.

## 2018-08-21 NOTE — Telephone Encounter (Signed)
Pt calling about some forms that were faxed to [place] from [facility] about his medical leave from work. Pt is out of work until 1-23 and pt needs these forms filled out along with a note from the dr stating he can return to work on 1-23. Forms are not in PCP box so don't know if we ever received them. Please let pt know if we have received them or not and if we haven't he will contact [employer] to resend them to [place]. Pt call back is [phone]. Please advise

## 2018-08-22 NOTE — Telephone Encounter (Signed)
I have not seen any [employer] forms for this patient, and as Dr. Mosetta Putt said, I really need to see him in clinic (have been trying to get him in for months but he missed a few appts). Please get him my next available.

## 2018-08-25 NOTE — Telephone Encounter (Signed)
Tried to contact pt and stated that call could not be completed to try again later.  If pt calls back please inform him of below.  He has an appointment on 1/22 with PCP if that is to late we can see about getting him in earlier. PLease ask him to notify [employer] that PCP does not have the forms so they can send more or he can bring them to his visit. Jason Pitts, [name], [name]

## 2018-08-26 NOTE — Telephone Encounter (Signed)
Pt is calling again to check on the status of the forms. I informed him of the messages below. Pt understands and said that he now has two packets from [employer]. He says he plans on making it to the appointment on 09/10/18.   Pt said he is going to drop off the forms this week so Dr. Chanetta Marshall can have them to look over before the appointment. Pt would like for Dr. Chanetta Marshall to call him on his wife's phone at [phone].

## 2018-08-27 ENCOUNTER — Telehealth: Payer: Self-pay | Admitting: Family Medicine

## 2018-08-27 NOTE — Telephone Encounter (Signed)
[employer] forms dropped off for at front desk for completion.  Verified that patient section of form has been completed.  Last DOS/WCC with PCP was 01/06/18.  Placed form in team folder to be completed by clinical staff.  Chari Manning

## 2018-08-27 NOTE — Telephone Encounter (Signed)
Called patient back on his wife's phone per request. He took leave for his dad's death. He says he has filled out the dates of his leave on the forms, which I will take a look at. He states his intended return is 09/11/2018.

## 2018-08-27 NOTE — Telephone Encounter (Signed)
Clinical info completed on FMLA form.  Place form in Dr. Christena Flake box for completion.  Jason Pitts, [name], [name]

## 2018-09-10 ENCOUNTER — Ambulatory Visit (INDEPENDENT_AMBULATORY_CARE_PROVIDER_SITE_OTHER): Payer: BLUE CROSS/BLUE SHIELD | Admitting: Family Medicine

## 2018-09-10 ENCOUNTER — Encounter: Payer: Self-pay | Admitting: Family Medicine

## 2018-09-10 VITALS — BP 150/100 | HR 63 | Temp 98.0°F | Ht 62.0 in | Wt 174.2 lb

## 2018-09-10 DIAGNOSIS — F5102 Adjustment insomnia: Secondary | ICD-10-CM | POA: Diagnosis not present

## 2018-09-10 DIAGNOSIS — I10 Essential (primary) hypertension: Secondary | ICD-10-CM | POA: Diagnosis not present

## 2018-09-10 DIAGNOSIS — I428 Other cardiomyopathies: Secondary | ICD-10-CM

## 2018-09-10 DIAGNOSIS — R413 Other amnesia: Secondary | ICD-10-CM

## 2018-09-10 MED ORDER — TRAZODONE HCL 50 MG PO TABS: 25.0000 mg | ORAL_TABLET | Freq: Every evening | ORAL | 3 refills | Status: DC | PRN

## 2018-09-10 MED FILL — traZODone HCL 50 MG TABS: 50 | 30 days supply | Qty: 30 | Fill #0

## 2018-09-10 MED FILL — ENTRESTO 97 MG-103 MG TAB: 97-103 | 30 days supply | Qty: 60 | Fill #1

## 2018-09-10 NOTE — Assessment & Plan Note (Signed)
Did not take Entresto today, has been out of this.  He states he is going to the pharmacy today to pick this up.  He has some affordability issues.  Encouraged him to call us when he has concerns with affordability in the future.

## 2018-09-10 NOTE — Assessment & Plan Note (Signed)
Given symptoms with exertion pushing a large group of carts at work, I wrote on his FMLA paperwork that he should have a weight limit of 20 pounds.  I also explicitly stated he should no longer push large groups of carts.  He is also overdue for his echo, gave him the number for the [employer] office to call and reschedule this.

## 2018-09-10 NOTE — Assessment & Plan Note (Signed)
Trial trazodone, he will call with how this is going.

## 2018-09-10 NOTE — Progress Notes (Signed)
CC: memory, HTN, FMLA   HPI  Memory - he feels like he has lost some memory since his dad passed. His sister called him at work to tell him that his dad had died and he had some what of a panic attack, was seen in the ED. He feels like maybe he has had some memory trouble before this - misplacing things and putting things in nonsensical places. He thinks maybe this is related to stress. He has been waking up in the middle of the night pacing some. His wife says he seemed to be much more confusion starting sometime in September. No acute event prior to that. He forgets things that she tells him, puts food in the wrong place in the cabinet versus fridge, and he reports he recently got lost in [place], which is where he grew up.  Dad passed on 07/26/2018 at 58 of CHF, Tuesday 1/28 is the return date for FMLA forms.   Trouble falling asleep and staying asleep, has tried melatonin with little help. He feels he is having trouble switching back from third shift.  HTN - didn't take entresto, out for 1 week and didn't have money to get it, now has the money, he will pick up today.   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  He says he will occasionally have chest pain while doing this.  No pain today or recently.  ROS: Denies CP, SOB, abdominal pain, dysuria, changes in BMs.   CC, SH/smoking status, and VS noted  Objective: BP (!) 150/100 Comment: 2nd attempt 150 100  Pulse 63   Temp 98 F (36.7 C) (Oral)   Ht 5' 2"  (1.575 m)   Wt 174 lb 4 oz (79 kg)   SpO2 98%   BMI 31.87 kg/m  Gen: NAD, alert, cooperative, and pleasant. HEENT: NCAT, EOMI, PERRL CV: RRR, no murmur Resp: CTAB, no wheezes, non-labored Ext: No edema, warm Neuro: Alert and oriented, Speech clear, No gross deficits  Assessment and plan:  MOCA 18   HTN (hypertension) Did not take Entresto today, has been out  of this.  He states he is going to the pharmacy today to pick this up.  He has some affordability issues.  Encouraged him to call us when he has concerns with affordability in the future.  Adjustment insomnia Trial trazodone, he will call with how this is going.  Memory loss of unknown cause Subacute onset of significant worsening memory.  His Moca is surprisingly low today at 18.  He completed 11 years of school, score was adjusted for this.  Neuro exam is normal.  No other signs of recent infection or concerns with previous syphilis.  Will do full dementia work-up with B12, folate, CBC, TSH, RPR, HIV, CMP, MRI.  MRI was scheduled.  Nonischemic cardiomyopathy ([place]) Given symptoms with exertion pushing a large group of carts at work, I wrote on his FMLA paperwork that he should have a weight limit of 20 pounds.  I also explicitly stated he should no longer push large groups of carts.  He is also overdue for his echo, gave him the number for the [employer] office to call and reschedule this.   Orders Placed This Encounter  Procedures  . MR Brain Wo Contrast    CMP ordered today. Epic order Wt  174/ ht 5'3/no claus/ no metal in  eyes/no brain sx/no eyes,ears, heart sx/ no implants/ no needs Ins- bcbs  Pda/pt     Standing Status:   Future    Standing Expiration Date:   11/09/2019    Order Specific Question:   ** REASON FOR EXAM (FREE TEXT)    Answer:   subacute memory loss    Order Specific Question:   What is the patient's sedation requirement?    Answer:   No Sedation    Order Specific Question:   Does the patient have a pacemaker or implanted devices?    Answer:   No    Order Specific Question:   Preferred imaging location?    Answer:   [facility] (table limit-550lbs)    Order Specific Question:   Radiology Contrast Protocol - do NOT remove file path    Answer:   \\[email or web address]  . TSH  . CBC  . CMP14+EGFR  . RPR  . HIV Antibody (routine  testing w rflx)  . Folate    Meds ordered this encounter  Medications  . traZODone (DESYREL) 50 MG tablet    Sig: Take 0.5-1 tablets (25-50 mg total) by mouth at bedtime as needed for sleep.    Dispense:  30 tablet    Refill:  3     Ralene Ok, MD, PGY3 09/10/2018 12:10 PM

## 2018-09-10 NOTE — Patient Instructions (Signed)
It was a pleasure to see you today! Thank you for choosing [facility] for your primary care. Jason Pitts was seen for blood pressure, memory trouble, work return.   Our plans for today were:  For your blood pressure, please pick up the entresto and restart it.   For your heart, please call the cardiology office to get your echo done. It was already ordered. [phone]  For your memory, we will do some blood work and an MRI of your brain.   For work, I have completed your FMLA paperwork.   Best,  Dr. Chanetta Marshall

## 2018-09-10 NOTE — Assessment & Plan Note (Signed)
Subacute onset of significant worsening memory.  His Moca is surprisingly low today at 18.  He completed 11 years of school, score was adjusted for this.  Neuro exam is normal.  No other signs of recent infection or concerns with previous syphilis.  Will do full dementia work-up with B12, folate, CBC, TSH, RPR, HIV, CMP, MRI.  MRI was scheduled.

## 2018-09-11 LAB — CMP14+EGFR
A/G RATIO: 1.7 (ref 1.2–2.2)
ALT: 19 IU/L (ref 0–44)
AST: 18 IU/L (ref 0–40)
Albumin: 4.3 g/dL (ref 3.8–4.9)
Alkaline Phosphatase: 103 IU/L (ref 39–117)
BUN/Creatinine Ratio: 14 (ref 9–20)
BUN: 13 mg/dL (ref 6–24)
Bilirubin Total: 0.3 mg/dL (ref 0.0–1.2)
CALCIUM: 9.5 mg/dL (ref 8.7–10.2)
CO2: 25 mmol/L (ref 20–29)
Chloride: 104 mmol/L (ref 96–106)
Creatinine, Ser: 0.96 mg/dL (ref 0.76–1.27)
GFR calc Af Amer: 100 mL/min/{1.73_m2} (ref >59)
GFR, EST NON AFRICAN AMERICAN: 87 mL/min/{1.73_m2} (ref >59)
Globulin, Total: 2.5 g/dL (ref 1.5–4.5)
Glucose: 90 mg/dL (ref 65–99)
Potassium: 4.3 mmol/L (ref 3.5–5.2)
Sodium: 143 mmol/L (ref 134–144)
Total Protein: 6.8 g/dL (ref 6.0–8.5)

## 2018-09-11 LAB — HIV ANTIBODY (ROUTINE TESTING W REFLEX): HIV Screen 4th Generation wRfx: NONREACTIVE

## 2018-09-11 LAB — CBC
Hematocrit: 40.9 % (ref 37.5–51.0)
Hemoglobin: 12.8 g/dL — ABNORMAL LOW (ref 13.0–17.7)
MCH: 24.1 pg — ABNORMAL LOW (ref 26.6–33.0)
MCHC: 31.3 g/dL — ABNORMAL LOW (ref 31.5–35.7)
MCV: 77 fL — ABNORMAL LOW (ref 79–97)
Platelets: 238 10*3/uL (ref 150–450)
RBC: 5.31 x10E6/uL (ref 4.14–5.80)
RDW: 15.7 % — ABNORMAL HIGH (ref 11.6–15.4)
WBC: 6 10*3/uL (ref 3.4–10.8)

## 2018-09-11 LAB — TSH: TSH: 0.857 u[IU]/mL (ref 0.450–4.500)

## 2018-09-11 LAB — FOLATE: Folate: 17.5 ng/mL (ref >3.0)

## 2018-09-11 LAB — RPR: RPR Ser Ql: NONREACTIVE

## 2018-09-12 ENCOUNTER — Telehealth: Payer: Self-pay | Admitting: Family Medicine

## 2018-09-12 NOTE — Telephone Encounter (Signed)
Pt called is asking about if the FMLA paperwork has already been done. Pt is also is requesting for a  work note. If this can be printed to two copies. Pt stated he is going back to work on Tuesday 09/16/2018. ad

## 2018-09-14 ENCOUNTER — Other Ambulatory Visit: Payer: BLUE CROSS/BLUE SHIELD

## 2018-09-15 ENCOUNTER — Telehealth: Payer: Self-pay

## 2018-09-15 ENCOUNTER — Telehealth: Payer: Self-pay | Admitting: Family Medicine

## 2018-09-15 NOTE — Telephone Encounter (Signed)
Pt calls nurse line stating he forgot to tell Dr. Chanetta Marshall he will not be getting his imaging done, due to cost. Pt stated his insurance will not cover the imagining and [facility] I wanting the 800+ dollars up front.

## 2018-09-15 NOTE — Telephone Encounter (Signed)
Pt called wanted to see if the paper work has been signed by Dr. Chanetta Marshall for him to go back to work. Please call pt back at [phone]. ad

## 2018-09-15 NOTE — Telephone Encounter (Signed)
Spoke to pt. He has spoken to [facility] and they have received the paperwork. "Everything is good to go to go back to work.".  Pt appreciates all the help Dr. Chanetta Marshall gave him and appreciated the call. Sunday Spillers, CMA

## 2018-09-15 NOTE — Telephone Encounter (Signed)
His FMLA paperwork, which included the weight stipulation of no lifting over 20 lbs and no pushing carts, was faxed to [facility] on the day of his last visit. He should call [employer] and make sure they got it. I also wrote on the fax sheet to place in batch scanning once it was faxed.

## 2018-09-15 NOTE — Telephone Encounter (Signed)
Please see other note about FML paperwork. This has been handled. Sunday Spillers, CMA

## 2018-09-16 NOTE — Telephone Encounter (Signed)
"  Call could not be completed at this time." If he calls back, I have messaged [facility] about trying to help with the insurance coverage of the MRI and someone will call him if we get that worked out.  Additionally, please tell him that ALL of his labs were normal, which we expected. This means we don't have a cause for his memory issues. We do really need the MRI.

## 2018-09-25 NOTE — Telephone Encounter (Signed)
Pt is calling and would like to know if Dr. Chanetta Marshall can adjust the restrictions on his FMLA. He said it states she can not life anything over 20 pounds but he said that it should state he can lift up to 25 pounds.   He said when he spoke with [employer] they said Dr. Chanetta Marshall could write a note and fax it to them at [phone].  Pt would like for someone to call him when this has been done. He said he can not return to work on Monday if they do not receive this note.

## 2018-09-26 NOTE — Telephone Encounter (Signed)
Will write letter to adjust weight limit to 25 lbs. I placed in fax box today, please let him know. I also told his wife who was here today. Gave her a copy of both the FMLA paperwork from last time and his letter.

## 2018-10-27 MED FILL — traZODone HCL 50 MG TABS: 50 | 30 days supply | Qty: 30 | Fill #1 | Status: TO

## 2018-10-27 MED FILL — CARVEDILOL 3.125 MG TABLET: 3.125 | 30 days supply | Qty: 60 | Fill #1 | Status: TO

## 2018-10-27 MED FILL — hydrALAZINE HCL 25 MG TABS: 25 | 30 days supply | Qty: 60 | Fill #1

## 2018-11-06 ENCOUNTER — Telehealth: Payer: Self-pay

## 2018-11-10 NOTE — Telephone Encounter (Signed)
COVID-19 Pre-Screening Questions:  Provider: 3-25 HILTY   Needs f/u  > 3 MONTHS  COVID-19 Pre-Screening Questions:  . Do you currently have a fever? NO (yes = cancel and refer to pcp for e-visit) .  Marland Kitchen Have you recently travelled on a cruise, internationally, or to [place], [place], [place], [place], [place], or [place], [place] [employer]) ? NO (yes = cancel, stay home, monitor symptoms, and contact pcp or initiate e-visit if symptoms develop) .  Marland Kitchen Have you been in contact with someone that is currently pending confirmation of Covid19 testing or has been confirmed to have the Covid19 virus?  NO (yes = cancel, stay home, away from tested individual, monitor symptoms, and contact pcp or initiate e-visit if symptoms develop) .  Marland Kitchen Are you currently experiencing fatigue or cough? NO (yes = pt should be prepared to have a mask placed at the time of their visit).  Cardiac Questionnaire:    Since your last visit or hospitalization:    1. Have you been having new or worsening chest pain? NO   2. Have you been having new or worsening shortness of breath? NO 3. Have you been having new or worsening leg swelling, wt gain, or increase in abdominal girth (pants fitting more tightly)? NO   4. Have you had any passing out spells? NO    *A YES to any of these questions would result in the appointment being kept. *If all the answers to these questions are NO, we should indicate that given the current situation regarding the worldwide coronarvirus pandemic, at the recommendation of the CDC, we are looking to limit gatherings in our waiting area, and thus will reschedule their appointment beyond four weeks from today.   _____________

## 2018-11-12 ENCOUNTER — Ambulatory Visit: Payer: BLUE CROSS/BLUE SHIELD | Admitting: Internal Medicine

## 2018-11-14 ENCOUNTER — Telehealth: Payer: Self-pay | Admitting: Internal Medicine

## 2018-11-14 NOTE — Telephone Encounter (Signed)
Called patient and LVM to call back to reschedule appointment for 3 months from now.

## 2018-11-21 ENCOUNTER — Telehealth: Payer: Self-pay | Admitting: Internal Medicine

## 2018-11-21 NOTE — Telephone Encounter (Signed)
Called patient and LVM to call back to schedule 3 month appointment with Dr. Rennis Golden.

## 2018-11-24 ENCOUNTER — Telehealth: Payer: Self-pay | Admitting: Internal Medicine

## 2018-11-24 NOTE — Telephone Encounter (Signed)
Called patient and LVM to call and schedule Dr. Blanchie Dessert appt.

## 2018-11-26 ENCOUNTER — Telehealth: Payer: Self-pay

## 2018-11-27 NOTE — Telephone Encounter (Signed)
S/w pt he is amenable to a telephone visit. BP cuff YES  scale  YES  MyChart NO  email YES    In the setting of the current Covid19 crisis, you are scheduled for a TELEPHONE visit with your provider on 12-09-2018 at 830AM.  Just as we do with many in-office visits, in order for you to participate in this visit, we must obtain consent.  If you'd like, I can send this to your mychart (if signed up) or email for you to review.  Otherwise, I can obtain your verbal consent now.  All virtual visits are billed to your insurance company just like a normal visit would be.  By agreeing to a virtual visit, we'd like you to understand that the technology does not allow for your provider to perform an examination, and thus may limit your provider's ability to fully assess your condition.  Finally, though the technology is pretty good, we cannot assure that it will always work on either your or our end, and in the setting of a video visit, we may have to convert it to a phone-only visit.  In either situation, we cannot ensure that we have a secure connection.  Are you willing to proceed?  Verbalized consent.'

## 2018-12-05 ENCOUNTER — Telehealth (INDEPENDENT_AMBULATORY_CARE_PROVIDER_SITE_OTHER): Payer: BLUE CROSS/BLUE SHIELD | Admitting: Family Medicine

## 2018-12-05 ENCOUNTER — Telehealth: Payer: Self-pay | Admitting: Adult Health

## 2018-12-05 ENCOUNTER — Other Ambulatory Visit: Payer: Self-pay

## 2018-12-05 DIAGNOSIS — M62838 Other muscle spasm: Secondary | ICD-10-CM

## 2018-12-05 MED ORDER — BACLOFEN 10 MG PO TABS: 10.0000 mg | ORAL_TABLET | Freq: Two times a day (BID) | ORAL | 0 refills | Status: DC | PRN

## 2018-12-05 MED FILL — BACLOFEN 10 MG TABS: 10 | 15 days supply | Qty: 30 | Fill #0

## 2018-12-05 NOTE — Progress Notes (Signed)
[facility] Telemedicine Visit  Patient consented to have virtual visit. Method of visit: Video was attempted, but technology challenges prevented patient from using video, so visit was conducted via telephone - patient was driving.    Encounter participants: Patient: Jason Pitts - located at car Provider: Renold Don - located at office Others (if applicable): n/a   Chief Complaint: muscle spasms  HPI:  Patient has been having increasing stress at work.  He works at [employer] and has been having to go when despite the coronavirus care.  He has been noticing tightness in right upper back and right shoulder.  He has been taking ibuprofen which relieves the pain.  However he is sometimes having trouble sleeping at night due to the pain and due to overall stress from work.  He did put in his 2-week notice today I wanted to take some time at home away from work.  He has had no numbness or tingling.  No weakness.  No falls.  No headaches.  No changes in his vision.  He otherwise feels well.  Thinks he would benefit from muscle relaxer to help with sleep at nighttime.  No fevers or chills.  No other illnesses.  No chest pain. No actual injury.  Just gradual worsening over past several weeks.   ROS: per HPI  Pertinent PMHx: history of CHF -- no current symptoms or issues  Exam: Gen:  Patient awake, alert, fully conversant, oriented x 4 Auditory:  Hearing normal Resp:  Good strong voice.  Speaking in full sentences.  No coughing during exam.  No respiratory distress.  No audible wheezing over the phone  Psych:  Linear and coherent thought process.  No flight of ideas.    Assessment/Plan:  1.  Muscle spasm: -Plan to treat with baclofen for pain relief. -He can continue as needed ibuprofen.  He himself is already limiting this because of his heart issues.  He is only taking this once every few days. -No chest pain.  Back pain is not exertional.  Dull aching pain in his  upper back and shoulder typical most of muscle spasm.  Plan to treat as such. -He will call us on Tuesday or Wednesday if he is not have any improvement or come in earlier to be seen if worsening.  Appreciative of call.  Time spent on phone with patient: 8 minutes

## 2018-12-05 NOTE — Telephone Encounter (Signed)
Smartphone/ my chart via email/ virtual consent/ pre reg completed  °

## 2018-12-08 NOTE — Progress Notes (Signed)
Virtual Visit via Telephone Note   This visit type was conducted due to national recommendations for restrictions regarding the COVID-19 Pandemic (e.g. social distancing) in an effort to limit this patient's exposure and mitigate transmission in our community.  Due to his co-morbid illnesses, this patient is at least at moderate risk for complications without adequate follow up.  This format is felt to be most appropriate for this patient at this time.  The patient did not have access to video technology/had technical difficulties with video requiring transitioning to audio format only (telephone).  All issues noted in this document were discussed and addressed.  No physical exam could be performed with this format.  Please refer to the patient's chart for his  consent to telehealth for [facility].   Patient has given verbal permission to conduct this visit via virtual appointment and to bill insurance 12/09/2018 8:33 AM     Evaluation Performed:  Follow-up visit  Date:  12/08/2018   ID:  Jason Pitts, DOB 1961-03-24, MRN [id number]  Patient Location: Home Provider Location: Home  PCP:  Garth Bigness, MD  Cardiologist:  Dr. Rennis Golden Electrophysiologist:  None   Chief Complaint:  CHF follow up  History of Present Illness:    Jason Pitts is a 58 y.o. 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  He also has a history of ongoing tobacco abuse.  On last visit with Dr. Rennis Golden on 05/15/2018 Entresto dose was increased to 97/103 mg twice daily.  He did have a virtual visit with his primary care physician Payton Mccallum on 12/05/2018 in the setting of muscle pain and spasms.  He works at [employer], and had been doing a lot of heavy lifting along with excessive stress from having to go into  work during the [employer] pandemic.  He was treated with muscle relaxers.  Blood pressure readings were not documented, however caution concerning his blood pressure control was given by PCP.   He states he is doing well. Took himself out of work at [employer] for two weeks due to fears of COVID pandemic. Started taking the muscle relaxer which has helped his spasms.  He denies fluid retention, DOE, fatigue, or chest discomfort. He is active. He is medically compliant.   The patient does not have symptoms concerning for COVID-19 infection (fever, chills, cough, or new shortness of breath).    Past Medical History:  Diagnosis Date  . HTN (hypertension)   . Tobacco use    Past Surgical History:  Procedure Laterality Date  . MANDIBLE FRACTURE SURGERY       No outpatient medications have been marked as taking for the 12/09/18 encounter (Appointment) with Jodelle Gross, NP.     Allergies:   Patient has no known allergies.   Social History   Tobacco Use  . Smoking status: Current Some Day Smoker    Packs/day: 0.30    Years: 40.00    Pack years: 12.00  . Smokeless tobacco: Never Used  Substance Use Topics  . Alcohol use: Yes  . Drug use: No     Family Hx: The patient's family history includes CAD in his father; Heart failure in his father.  ROS:   Please see the history of present illness.    All other systems reviewed and are negative.   Prior  CV studies:   The following studies were reviewed today: Echocardiogram 02/23/2017 Left ventricle: The cavity size was normal. Wall thickness was   increased in a pattern of severe LVH. Systolic function was   moderately to severely reduced. The estimated ejection fraction   was in the range of 30% to 35%. Wall motion was normal; there   were no regional wall motion abnormalities. Doppler parameters   are consistent with abnormal left ventricular relaxation (grade 1   diastolic dysfunction). - Ventricular septum: Septal motion  showed abnormal function and   dyssynergy. - Aorta: Aortic root dimension: 43 mm (ED). - Ascending aorta: The ascending aorta was mildly dilated. - Left atrium: The atrium was mildly dilated. - Right atrium: The atrium was mildly dilated. - Tricuspid valve: Moderately thickened leaflets.  Labs/Other Tests and Data Reviewed:    EKG:  No ECG reviewed.  Recent Labs: 09/10/2018: ALT 19; BUN 13; Creatinine, Ser 0.96; Hemoglobin 12.8; Platelets 238; Potassium 4.3; Sodium 143; TSH 0.857   Recent Lipid Panel Lab Results  Component Value Date/Time   CHOL 160 03/04/2017 08:53 AM   TRIG 85 03/04/2017 08:53 AM   HDL 44 03/04/2017 08:53 AM   CHOLHDL 3.6 03/04/2017 08:53 AM   LDLCALC 99 03/04/2017 08:53 AM    Wt Readings from Last 3 Encounters:  09/10/18 174 lb 4 oz (79 kg)  07/22/18 172 lb (78 kg)  05/15/18 166 lb 6.4 oz (75.5 kg)     Objective:    Vital Signs:  There were no vitals taken for this visit.   Assessment limited due to phone call. BP was unable to be taken due to no batteries in his machine.    ASSESSMENT & PLAN:    1. Malignant Hypertension: BP has been elevated, but patient did not check BP at home. He has a machine and is going to get batteries to call us back with the readings. IF elevated, will increase hydralazine to 50 mg BID, continue coreg 3.125 and Entresto 97/103 mg as directed.   If BP readings are improved, he will be given a letter allowing him to return to work at [employer] on 12/16/2018 without restrictions.   2. Systolic Dysfunction: He will need follow up echocardiogram once testing is available again due to pandemic restrictions. This will be scheduled for re-assessment of LV function. May need to increase coreg if HR needs better control in this setting.   3. Ongoing tobacco abuse: He is down to 3 cigarettes a day. I have encouraged him to continue to reduce the amount of smoking  He verbalizes understanding.   COVID-19 Education: The signs and  symptoms of COVID-19 were discussed with the patient and how to seek care for testing (follow up with PCP or arrange E-visit).  The importance of social distancing was discussed today.  Time:   Today, I have spent 15 minutes with the patient with telehealth technology discussing the above problems.     Medication Adjustments/Labs and Tests Ordered: Current medicines are reviewed at length with the patient today.  Concerns regarding medicines are outlined above.   Tests Ordered: No orders of the defined types were placed in this encounter.   Medication Changes: No orders of the defined types were placed in this encounter.   Disposition:  Follow up 4-6 weeks, when first available face to face is available.   Signed, Bettey Mare. Liborio Nixon, ANP, AACC  12/08/2018 7:45 AM      [facility]

## 2018-12-09 ENCOUNTER — Telehealth (INDEPENDENT_AMBULATORY_CARE_PROVIDER_SITE_OTHER): Payer: BLUE CROSS/BLUE SHIELD | Admitting: Adult Health

## 2018-12-09 ENCOUNTER — Telehealth: Payer: Self-pay

## 2018-12-09 ENCOUNTER — Encounter: Payer: Self-pay | Admitting: Adult Health

## 2018-12-09 VITALS — BP 192/116 | HR 91 | Ht 62.0 in | Wt 172.0 lb

## 2018-12-09 DIAGNOSIS — I428 Other cardiomyopathies: Secondary | ICD-10-CM

## 2018-12-09 DIAGNOSIS — I10 Essential (primary) hypertension: Secondary | ICD-10-CM

## 2018-12-09 DIAGNOSIS — Z79899 Other long term (current) drug therapy: Secondary | ICD-10-CM

## 2018-12-09 DIAGNOSIS — Z720 Tobacco use: Secondary | ICD-10-CM

## 2018-12-09 NOTE — Telephone Encounter (Signed)
Pt has high BP.

## 2018-12-09 NOTE — Patient Instructions (Signed)
RTW 4-28? HILTY ^^^COREG 6.25?

## 2018-12-10 MED ORDER — HYDRALAZINE HCL 50 MG PO TABS: 50.0000 mg | ORAL_TABLET | Freq: Three times a day (TID) | ORAL | 3 refills | Status: DC

## 2018-12-10 MED ORDER — SACUBITRIL-VALSARTAN 97-103 MG PO TABS: 1.0000 | ORAL_TABLET | Freq: Two times a day (BID) | ORAL | 11 refills | Status: DC

## 2018-12-10 NOTE — Telephone Encounter (Signed)
Pt states that he has taken his medication this morning. He states that he feels much better today and hardly any pain, he has "calmed himself down" and feels much better today. His BP 1st try is 201/131 HR 81  2nd try 178/123 HR 81. He states tat his BP cuff is old and doesn't know how accurate it is and he now states that he just took his medication. He will wait 1 hour and take again and see how it is running. He will CB and give [place] th numbers, informed pt to take BP 1 hour at least after taking medication and 30 minutes between taking the BP unless you get an error code on the machine. Wife states that she prefers that wrist monitors she states that she get less error codes. Informed her that the wrist monitors are not as accurate as the upper arm monitors, she states that is what she was told also.

## 2018-12-10 NOTE — Telephone Encounter (Signed)
Pt went to the fire station and they took his BP 172/128. This is about what his BP cuff was getting. Will forward for further instruction

## 2018-12-10 NOTE — Telephone Encounter (Signed)
Pt called stated is waiting on a call back he was told he would get this morning.  Pt stated thanks

## 2018-12-10 NOTE — Telephone Encounter (Signed)
Agree, thank you

## 2018-12-10 NOTE — Telephone Encounter (Signed)
Please increase his hydralazine to 50 mg TID. He will not be given a letter to return to work until BP is better controlled. Send him a low sodium diet if he doesn't have one already. He needs to have labs next week, on Monday 12/15/2018 Follow up next week with his primary cardiologist.

## 2018-12-11 MED FILL — ENTRESTO 97 MG-103 MG TAB: 97-103 | 30 days supply | Qty: 60 | Fill #0

## 2018-12-11 MED FILL — hydrALAZINE HCL 50 MG TABS: 50 | 30 days supply | Qty: 90 | Fill #0

## 2018-12-16 LAB — BASIC METABOLIC PANEL
BUN/Creatinine Ratio: 17 (ref 9–20)
BUN: 21 mg/dL (ref 6–24)
CO2: 20 mmol/L (ref 20–29)
Calcium: 9.4 mg/dL (ref 8.7–10.2)
Chloride: 104 mmol/L (ref 96–106)
Creatinine, Ser: 1.24 mg/dL (ref 0.76–1.27)
GFR calc Af Amer: 73 mL/min/{1.73_m2} (ref >59)
GFR calc non Af Amer: 63 mL/min/{1.73_m2} (ref >59)
Glucose: 95 mg/dL (ref 65–99)
Potassium: 4.4 mmol/L (ref 3.5–5.2)
Sodium: 142 mmol/L (ref 134–144)

## 2018-12-18 ENCOUNTER — Telehealth (INDEPENDENT_AMBULATORY_CARE_PROVIDER_SITE_OTHER): Payer: BLUE CROSS/BLUE SHIELD | Admitting: Internal Medicine

## 2018-12-18 VITALS — BP 118/79 | Ht 62.0 in | Wt 180.0 lb

## 2018-12-18 DIAGNOSIS — I428 Other cardiomyopathies: Secondary | ICD-10-CM | POA: Diagnosis not present

## 2018-12-18 DIAGNOSIS — I5043 Acute on chronic combined systolic (congestive) and diastolic (congestive) heart failure: Secondary | ICD-10-CM

## 2018-12-18 DIAGNOSIS — I10 Essential (primary) hypertension: Secondary | ICD-10-CM

## 2018-12-18 DIAGNOSIS — I5022 Chronic systolic (congestive) heart failure: Secondary | ICD-10-CM | POA: Diagnosis not present

## 2018-12-18 NOTE — Patient Instructions (Signed)
Medication Instructions:  Your Physician recommend you continue on your current medication as directed.    If you need a refill on your cardiac medications before your next appointment, please call your pharmacy.   Lab work: None  Testing/Procedures: Your physician has requested that you have an echocardiogram. Echocardiography is a painless test that uses sound waves to create images of your heart. It provides your doctor with information about the size and shape of your heart and how well your heart's chambers and valves are working. This procedure takes approximately one hour. There are no restrictions for this procedure. [address]   Follow-Up: At [facility], you and your health needs are our priority.  As part of our continuing mission to provide you with exceptional heart care, we have created designated Provider Care Teams.  These Care Teams include your primary Cardiologist (physician) and Advanced Practice Providers (APPs -  Physician Assistants and Nurse Practitioners) who all work together to provide you with the care you need, when you need it. You will need a follow up appointment in 6 months.  Please call our office 2 months in advance to schedule this appointment.  You may see Chrystie Nose, MD or one of the following Advanced Practice Providers on your designated Care Team: [place], [place] . Micah Flesher, PA-C

## 2018-12-18 NOTE — Progress Notes (Signed)
Virtual Visit via Telephone Note   This visit type was conducted due to national recommendations for restrictions regarding the COVID-19 Pandemic (e.g. social distancing) in an effort to limit this patient's exposure and mitigate transmission in our community.  Due to his co-morbid illnesses, this patient is at least at moderate risk for complications without adequate follow up.  This format is felt to be most appropriate for this patient at this time.  The patient did not have access to video technology/had technical difficulties with video requiring transitioning to audio format only (telephone).  All issues noted in this document were discussed and addressed.  No physical exam could be performed with this format.  Please refer to the patient's chart for his  consent to telehealth for [facility].   Evaluation Performed:  Telephone follow-up  Date:  12/18/2018   ID:  Jason Pitts, DOB 07-Apr-1961, MRN [id number]  Patient Location:  [address] [place] [address]  Provider location:   [address] [place], [place] [address]  PCP:  Jason Bigness, MD  Cardiologist:  Jason Nose, MD Electrophysiologist:  None   Chief Complaint:  Follow-up visit  History of Present Illness:    Jason Pitts is a 58 y.o. male who presents via audio/video conferencing for a telehealth visit today.  I spoke with Jason Pitts today for a follow-up visit.  He was recently seen in a telehealth visit by Jason Picket, DNP, who had noted he had significant malignant hypertension.  He was under a lot of stress and concerned about the COVID-19 virus.  He works at [employer].  He was taken out of work and took 2 weeks off.  He was able to improve his blood pressure and subsequently she also increased his hydralazine.  He had problems with muscle spasms and was given a muscle relaxant by his PCP which has helped.  I am pleased to say that his blood pressure has improved today and  now is at goal.  Recent lab work was also performed which showed stable renal function and normal potassium.  Overall he feels well and is ready to get back to work.  He has a history of nonischemic cardiomyopathy with EF 30 to 35% in 2018.  He was supposed to have a repeat echo however that was never scheduled and due to the current COVID pandemic, scheduling it will be delayed.  He is also on high-dose Entresto.  The patient does not have symptoms concerning for COVID-19 infection (fever, chills, cough, or new SHORTNESS OF BREATH).    Prior CV studies:   The following studies were reviewed today:  Recent office visit Lab work  PMHx:  Past Medical History:  Diagnosis Date  . HTN (hypertension)   . Tobacco use     Past Surgical History:  Procedure Laterality Date  . MANDIBLE FRACTURE SURGERY      FAMHx:  Family History  Problem Relation Age of Onset  . Heart failure Father   . CAD Father        3's    SOCHx:   reports that he has been smoking. He has a 12.00 pack-year smoking history. He has never used smokeless tobacco. He reports current alcohol use. He reports that he does not use drugs.  ALLERGIES:  No Known Allergies  MEDS:  Current Meds  Medication Sig  . aspirin EC 81 MG tablet Take 1 tablet (81 mg total) by mouth daily.  . baclofen (LIORESAL) 10 MG  tablet Take 1 tablet (10 mg total) by mouth 2 (two) times daily as needed for muscle spasms.  . carvedilol (COREG) 3.125 MG tablet TAKE ONE TABLET BY MOUTH TWICE DAILY WITH A MEAL  . hydrALAZINE (APRESOLINE) 50 MG tablet Take 1 tablet (50 mg total) by mouth 3 (three) times daily.  . Melatonin 1 MG TABS Take 1 tablet by mouth daily.  . Multiple Vitamin (MULTIVITAMIN) tablet Take 1 tablet by mouth daily.  . sacubitril-valsartan (ENTRESTO) 97-103 MG Take 1 tablet by mouth 2 (two) times daily.  . traZODone (DESYREL) 50 MG tablet Take 0.5-1 tablets (25-50 mg total) by mouth at bedtime as needed for sleep.  . varenicline  (CHANTIX PAK) 0.5 MG X 11 & 1 MG X 42 tablet Take one 0.5 mg tablet once daily for 3 days, then increase to one 0.5 mg twice daily for 4 days, then increase to one 1 mg tab twice a day.     ROS: Pertinent items noted in HPI and remainder of comprehensive ROS otherwise negative.  Labs/Other Tests and Data Reviewed:    Recent Labs: 09/10/2018: ALT 19; Hemoglobin 12.8; Platelets 238; TSH 0.857 12/15/2018: BUN 21; Creatinine, Ser 1.24; Potassium 4.4; Sodium 142   Recent Lipid Panel Lab Results  Component Value Date/Time   CHOL 160 03/04/2017 08:53 AM   TRIG 85 03/04/2017 08:53 AM   HDL 44 03/04/2017 08:53 AM   CHOLHDL 3.6 03/04/2017 08:53 AM   LDLCALC 99 03/04/2017 08:53 AM    Wt Readings from Last 3 Encounters:  12/18/18 180 lb (81.6 kg)  12/09/18 172 lb (78 kg)  09/10/18 174 lb 4 oz (79 kg)     Exam:    Vital Signs:  BP 118/79   Ht 5\' 2"  (1.575 m)   Wt 180 lb (81.6 kg)   BMI 32.92 kg/m    No exam due to telephone visit  ASSESSMENT & PLAN:    1. Malignant hypertension 2. Chronic systolic congestive heart failure -of EF 30-35%, dilated ventricle 3. 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  He has been more relaxed as he is been off work for several weeks.  He said he would be willing to go back to work today however does have approval to stay out of work until May 12.  Per his request, we will keep him out of work until Dec 30, 2018 which time he may resume work without restriction at [employer].  I provided a letter to him today regarding this.  He will continue his current medications.  We will also obtain a repeat echocardiogram once were able to do elective test to reassess for improvement in LV function.  Plan follow-up with me in 6 months or sooner as necessary.  COVID-19 Education: The signs and symptoms of COVID-19 were discussed with the patient and how to seek care for testing (follow up with PCP or arrange  E-visit).  The importance of social distancing was discussed today.  Patient Risk:   After full review of this patients clinical status, I feel that they are at least moderate risk at this time.  Time:   Today, I have spent 25 minutes with the patient with telehealth technology discussing heart failure, blood pressure management, dilated aortic root, need for echo.     Medication Adjustments/Labs and Tests Ordered: Current medicines are reviewed at length with the patient today.  Concerns regarding medicines are outlined above.   Tests Ordered:  No orders of the defined types were placed in this encounter.   Medication Changes: No orders of the defined types were placed in this encounter.   Disposition:  in 6 month(s)  Jason NoseKenneth C. , MD, [facility] Asc[facility]ACC, FACP  [place]  [facility]  Medical Director of the Advanced Lipid Disorders [facility]  Cardiovascular Risk Reduction [facility] Diplomate of the American Board of Clinical Lipidology Attending Cardiologist  Direct Dial: [phone]  Fax: [phone]  Website:  [email or web address]  Jason NoseKenneth C , MD  12/18/2018 3:31 PM

## 2018-12-29 MED FILL — CARVEDILOL 3.125 MG TABLET: 3.125 | 30 days supply | Qty: 60 | Fill #0

## 2018-12-29 MED FILL — traZODone HCL 50 MG TABS: 50 | 30 days supply | Qty: 30 | Fill #0

## 2019-01-19 ENCOUNTER — Telehealth: Payer: BLUE CROSS/BLUE SHIELD | Admitting: Physician Assistant

## 2019-01-19 ENCOUNTER — Encounter: Payer: Self-pay | Admitting: Physician Assistant

## 2019-01-19 ENCOUNTER — Telehealth: Payer: Self-pay | Admitting: Internal Medicine

## 2019-01-19 NOTE — Telephone Encounter (Signed)
New Message     Pt is calling regarding his BP. He says he doesn't know the last reading right now and has no symptoms but it has been running high    Please call

## 2019-01-19 NOTE — Telephone Encounter (Signed)
Spoke with patient who reports his BP is now 183/124 and HR 62.   Advised patient I will check on provider schedule availability to do an e-visit to have BP addressed.  Phone disconnected. Will call back.   He also needs echo scheduled per 12/18/18 visit - message sent to scheduler.   Returned call to patient and added him on for virtual visit with [place] PA @ 1045.

## 2019-01-19 NOTE — Progress Notes (Unsigned)
{Choose 1 Note Type (Telehealth Visit or Telephone Visit):[phone]}   Date:  01/19/2019   ID:  Jason Pitts, DOB [birth year], MRN [id number]  {Patient Location:[phone]::"Home"} {Provider Location:[phone]::"Home"}  PCP:  Garth Bigness, MD  Cardiologist:  Chrystie Nose, MD *** Electrophysiologist:  None   Evaluation Performed:  {Choose Visit [phone]::"Follow-Up Visit"}  Chief Complaint:  ***  History of Present Illness:    Jason Pitts is a 58 y.o. male with past medical history of hypertension, NICM with LV EF 35%, TAA, chronic chest pain, and ongoing tobacco use.  He was previously seen by Dr. Rennis Golden in September 2019, Sherryll Burger was increased to 97/103 mg twice daily at the time.  Myoview obtained on 03/04/2017 showed EF 26%, no ischemia.  Overall study came back high risk based on reduced LV function.  Last echocardiogram obtained on 03/05/2017 showed EF 30 to 35%, grade 1 DD, dyssynergy in the ventricular septal motion, aortic root measuring at 43 mm.  More recently, patient was seen by Bailey Mech, NP on 12/09/2018 for uncontrolled high blood pressure.  Hydralazine was increased.  On follow-up with Dr. Rennis Golden on 4/30, her blood pressure has significantly improved.  The patient {does/does not:200015} have symptoms concerning for COVID-19 infection (fever, chills, cough, or new shortness of breath).    Past Medical History:  Diagnosis Date  . HTN (hypertension)   . Tobacco use    Past Surgical History:  Procedure Laterality Date  . MANDIBLE FRACTURE SURGERY       No outpatient medications have been marked as taking for the 01/19/19 encounter (Appointment) with Azalee Course, PA.     Allergies:   Patient has no known allergies.   Social History   Tobacco Use  . Smoking status: Current Every Day Smoker    Packs/day: 0.30    Years: 40.00    Pack years: 12.00  . Smokeless tobacco: Never Used  Substance Use Topics  . Alcohol use: Yes  . Drug use: No      Family Hx: The patient's family history includes CAD in his father; Heart failure in his father.  ROS:   Please see the history of present illness.    *** All other systems reviewed and are negative.   Prior CV studies:   The following studies were reviewed today:  Myoview 03/04/2017  There was no ST segment deviation noted during stress.  Nuclear stress EF: 26%. The left ventricular ejection fraction is severely decreased (<30%).  There is no ischemia.  The LV is severely dilated. There is global hypokinesis.  This is a high risk study based on the reduced LV function.   Echo 03/05/2017 LV EF: 30% -   35% Study Conclusions  - Left ventricle: The cavity size was normal. Wall thickness was   increased in a pattern of severe LVH. Systolic function was   moderately to severely reduced. The estimated ejection fraction   was in the range of 30% to 35%. Wall motion was normal; there   were no regional wall motion abnormalities. Doppler parameters   are consistent with abnormal left ventricular relaxation (grade 1   diastolic dysfunction). - Ventricular septum: Septal motion showed abnormal function and   dyssynergy. - Aorta: Aortic root dimension: 43 mm (ED). - Ascending aorta: The ascending aorta was mildly dilated. - Left atrium: The atrium was mildly dilated. - Right atrium: The atrium was mildly dilated. - Tricuspid valve: Moderately thickened leaflets.  Labs/Other Tests and Data Reviewed:    EKG:  {EKG/Telemetry Strips  Reviewed:[phone]}  Recent Labs: 09/10/2018: ALT 19; Hemoglobin 12.8; Platelets 238; TSH 0.857 12/15/2018: BUN 21; Creatinine, Ser 1.24; Potassium 4.4; Sodium 142   Recent Lipid Panel Lab Results  Component Value Date/Time   CHOL 160 03/04/2017 08:53 AM   TRIG 85 03/04/2017 08:53 AM   HDL 44 03/04/2017 08:53 AM   CHOLHDL 3.6 03/04/2017 08:53 AM   LDLCALC 99 03/04/2017 08:53 AM    Wt Readings from Last 3 Encounters:  12/18/18 180 lb (81.6  kg)  12/09/18 172 lb (78 kg)  09/10/18 174 lb 4 oz (79 kg)     Objective:    Vital Signs:  There were no vitals taken for this visit.   {[facility] Virtual Exam (Optional):[phone]::"VITAL SIGNS:  reviewed"}  ASSESSMENT & PLAN:    1. ***  COVID-19 Education: The signs and symptoms of COVID-19 were discussed with the patient and how to seek care for testing (follow up with PCP or arrange E-visit).  ***The importance of social distancing was discussed today.  Time:   Today, I have spent *** minutes with the patient with telehealth technology discussing the above problems.     Medication Adjustments/Labs and Tests Ordered: Current medicines are reviewed at length with the patient today.  Concerns regarding medicines are outlined above.   Tests Ordered: No orders of the defined types were placed in this encounter.   Medication Changes: No orders of the defined types were placed in this encounter.   Disposition:  Follow up {follow up:15908}  Ramond Dial, PA  01/19/2019 10:48 AM    [facility]

## 2019-01-19 NOTE — Telephone Encounter (Signed)
Returned call to patient of Dr. Rennis Golden who reports his BP has been up. He was out of work last Thursday, Friday, Saturday- BP was 170s systolic.   He checked BP while on the phone - 200/133 and he tried in the other arm with an error code. Asked that patient attempt to recheck BP and I will call back in about 15 mins for reading.

## 2019-01-19 NOTE — Telephone Encounter (Signed)
Attempted to call patient to obtain additional BP reading. Phone rang with no answer

## 2019-01-19 NOTE — Telephone Encounter (Signed)
YOUR CARDIOLOGY TEAM HAS ARRANGED FOR AN E-VISIT FOR YOUR APPOINTMENT - PLEASE REVIEW IMPORTANT INFORMATION BELOW SEVERAL DAYS PRIOR TO YOUR APPOINTMENT  Due to the recent COVID-19 pandemic, we are transitioning in-person office visits to tele-medicine visits in an effort to decrease unnecessary exposure to our patients, their families, and staff. These visits are billed to your insurance just like a normal visit is. We also encourage you to sign up for MyChart if you have not already done so. You will need a smartphone if possible. For patients that do not have this, we can still complete the visit using a regular telephone but do prefer a smartphone to enable video when possible. You may have a family member that lives with you that can help. If possible, we also ask that you have a blood pressure cuff and scale at home to measure your blood pressure, heart rate and weight prior to your scheduled appointment. Patients with clinical needs that need an in-person evaluation and testing will still be able to come to the office if absolutely necessary. If you have any questions, feel free to call our office.     YOUR PROVIDER WILL BE USING THE FOLLOWING PLATFORM TO COMPLETE YOUR VISIT: PHONE CALL ONLY  2-3 DAYS BEFORE YOUR APPOINTMENT - N/A  You will receive a telephone call from one of our [facility] team members - your caller ID may say "Unknown caller." If this is a video visit, we will walk you through how to get the video launched on your phone. We will remind you check your blood pressure, heart rate and weight prior to your scheduled appointment. If you have an Apple Watch or Kardia, please upload any pertinent ECG strips the day before or morning of your appointment to MyChart. Our staff will also make sure you have reviewed the consent and agree to move forward with your scheduled tele-health visit.     THE DAY OF YOUR APPOINTMENT  Approximately 15 minutes prior to your scheduled appointment,  you will receive a telephone call from one of [facility] team - your caller ID may say "Unknown caller."  Our staff will confirm medications, vital signs for the day and any symptoms you may be experiencing. Please have this information available prior to the time of visit start. It may also be helpful for you to have a pad of paper and pen handy for any instructions given during your visit. They will also walk you through joining the smartphone meeting if this is a video visit.    CONSENT FOR TELE-HEALTH VISIT - PLEASE REVIEW  I hereby voluntarily request, consent and authorize [facility] and its employed or contracted physicians, physician assistants, nurse practitioners or other licensed health care professionals (the Practitioner), to provide me with telemedicine health care services (the "Services") as deemed necessary by the treating Practitioner. I acknowledge and consent to receive the Services by the Practitioner via telemedicine. I understand that the telemedicine visit will involve communicating with the Practitioner through live audiovisual communication technology and the disclosure of certain medical information by electronic transmission. I acknowledge that I have been given the opportunity to request an in-person assessment or other available alternative prior to the telemedicine visit and am voluntarily participating in the telemedicine visit.  I understand that I have the right to withhold or withdraw my consent to the use of telemedicine in the course of my care at any time, without affecting my right to future care or treatment, and that the Practitioner or I  may terminate the telemedicine visit at any time. I understand that I have the right to inspect all information obtained and/or recorded in the course of the telemedicine visit and may receive copies of available information for a reasonable fee.  I understand that some of the potential risks of receiving the Services via  telemedicine include:  Marland Kitchen Delay or interruption in medical evaluation due to technological equipment failure or disruption; . Information transmitted may not be sufficient (e.g. poor resolution of images) to allow for appropriate medical decision making by the Practitioner; and/or  . In rare instances, security protocols could fail, causing a breach of personal health information.  Furthermore, I acknowledge that it is my responsibility to provide information about my medical history, conditions and care that is complete and accurate to the best of my ability. I acknowledge that Practitioner's advice, recommendations, and/or decision may be based on factors not within their control, such as incomplete or inaccurate data provided by me or distortions of diagnostic images or specimens that may result from electronic transmissions. I understand that the practice of medicine is not an exact science and that Practitioner makes no warranties or guarantees regarding treatment outcomes. I acknowledge that I will receive a copy of this consent concurrently upon execution via email to the email address I last provided but may also request a printed copy by calling the office of [facility].    I understand that my insurance will be billed for this visit.   I have read or had this consent read to me. . I understand the contents of this consent, which adequately explains the benefits and risks of the Services being provided via telemedicine.  . I have been provided ample opportunity to ask questions regarding this consent and the Services and have had my questions answered to my satisfaction. . I give my informed consent for the services to be provided through the use of telemedicine in my medical care  By participating in this telemedicine visit I agree to the above.

## 2019-01-20 ENCOUNTER — Other Ambulatory Visit: Payer: Self-pay

## 2019-01-20 ENCOUNTER — Encounter: Payer: Self-pay | Admitting: Family Medicine

## 2019-01-20 ENCOUNTER — Ambulatory Visit (INDEPENDENT_AMBULATORY_CARE_PROVIDER_SITE_OTHER): Payer: BC Managed Care – PPO | Admitting: Family Medicine

## 2019-01-20 VITALS — BP 130/98 | HR 65

## 2019-01-20 DIAGNOSIS — I10 Essential (primary) hypertension: Secondary | ICD-10-CM | POA: Diagnosis not present

## 2019-01-20 MED ORDER — HYDRALAZINE HCL 50 MG PO TABS: 100.0000 mg | ORAL_TABLET | Freq: Three times a day (TID) | ORAL | 3 refills | Status: DC

## 2019-01-20 MED FILL — hydrALAZINE HCL 50 MG TABS: 50 | 15 days supply | Qty: 90 | Fill #0

## 2019-01-20 NOTE — Patient Instructions (Signed)
Please take two of the 50mg  three times per day of the hydralazine until you run out of those, then use the 100mg  tablets 3 times per day.

## 2019-01-20 NOTE — Progress Notes (Signed)
   CC: BP   HPI  Yesterday his BP was 174/120 with arm cuff, says he checked twice and was still high. He is stressed at work because he works at [employer]. He has had his hours cut and is working one day per week. Last week he also had 2 days when his BP was up. No chance that he forgot his meds those days per his report. Says he is taking his hydral TID. They have pill boxes at home but he isn't using. His pulses have been 79 this morning at home on their BP cuff.   He is smoking 3-4 cigarettes per day.   ROS: Denies CP, SOB, abdominal pain, dysuria, changes in BMs.   CC, SH/smoking status, and VS noted  Objective: BP (!) 130/98   Pulse 65   SpO2 98%  Gen: NAD, alert, cooperative, and pleasant. HEENT: NCAT, EOMI, PERRL CV: RRR, no murmur Resp: CTAB, no wheezes, non-labored Ext: No edema, warm Neuro: Alert and oriented, Speech clear, No gross deficits  Assessment and plan:  Malignant hypertension Continued trouble w BP control. Hydral max dose is 300mg /day divided TID. Currently he is taking 50 MG TID, so we will increase to 100mg  TID. Additional agents we could consider if this doesn't achieve control are - generic imdur (together this may provide the benefit of bidil, which he was not able to afford in the past), norvasc, or a diuretic. Virtual visit f/u next week with me, call me if BPs are high before then. Encouraged him to use a pill box so that he would know (by pills left in the slot) if he forgot pills by accident, his wife will help with that.    No orders of the defined types were placed in this encounter.   Meds ordered this encounter  Medications  . hydrALAZINE (APRESOLINE) 50 MG tablet    Sig: Take 2 tablets (100 mg total) by mouth 3 (three) times daily.    Dispense:  90 tablet    Refill:  3     Loni Muse, MD, PGY3 01/21/2019 11:44 AM

## 2019-01-21 NOTE — Assessment & Plan Note (Signed)
Continued trouble w BP control. Hydral max dose is 300mg /day divided TID. Currently he is taking 50 MG TID, so we will increase to 100mg  TID. Additional agents we could consider if this doesn't achieve control are - generic imdur (together this may provide the benefit of bidil, which he was not able to afford in the past), norvasc, or a diuretic. Virtual visit f/u next week with me, call me if BPs are high before then. Encouraged him to use a pill box so that he would know (by pills left in the slot) if he forgot pills by accident, his wife will help with that.

## 2019-02-02 ENCOUNTER — Telehealth (HOSPITAL_COMMUNITY): Payer: Self-pay | Admitting: *Deleted

## 2019-02-02 NOTE — Telephone Encounter (Signed)
Exam rescheduled, death in family  [facility]

## 2019-02-03 ENCOUNTER — Other Ambulatory Visit (HOSPITAL_COMMUNITY): Payer: BC Managed Care – PPO

## 2019-02-10 ENCOUNTER — Telehealth (HOSPITAL_COMMUNITY): Payer: Self-pay | Admitting: Radiology

## 2019-02-10 NOTE — Telephone Encounter (Signed)

## 2019-02-11 ENCOUNTER — Other Ambulatory Visit (HOSPITAL_COMMUNITY): Payer: BC Managed Care – PPO

## 2019-02-11 ENCOUNTER — Encounter (HOSPITAL_COMMUNITY): Payer: Self-pay

## 2019-02-26 ENCOUNTER — Ambulatory Visit: Payer: BC Managed Care – PPO

## 2019-02-26 DIAGNOSIS — I10 Essential (primary) hypertension: Secondary | ICD-10-CM | POA: Insufficient documentation

## 2019-02-26 DIAGNOSIS — Z87898 Personal history of other specified conditions: Secondary | ICD-10-CM | POA: Insufficient documentation

## 2019-02-26 NOTE — Progress Notes (Deleted)
[facility]:   Patient is accompanied by: {relatives - adult:5061::"spouse"} Primary caregiver: {relatives - adult:5061::"spouse"} Patient's {living arrangement:5711::"lives with their family"}. Patient information was obtained from {info source:60032}. History/Exam limitations: {limitations:60112::"none"}. Primary Care Provider: Unknown Jim, DO Referring provider: *** Reason for referral: No chief complaint on file.  Previous Report Reviewed: {Outside review:15817}    Patient's Care Team No care team member to display  ------------------------------------------------------------------------------------------------------------------------------------------------------------------------------------------------------------------------------------------------------------------------------------------------------------------------------------------------------------------------------------------------------------------------------------------------------------------------------------------------------------   HPI by problems:  No chief complaint on file.   Cognitive impairment concern  Are there problems with thinking?  {ms cognitive sx:18014}  When were the changes first noticed?  {Time; several weeks to lifelong:15123} ago  Did this change occur abruptly or gradually?  {abrupt, gradual:20671}  How have the changes progressed since then?  {Desc; progression:18636}  Has there been any tremors or abnormal movements?  {YES/NO/WILD OFBPZ:02585}  Have they had in hallucinations or delusions:  {YES/NO/WILD CARDS:18581}  Have they appeared more anxious or sad lately?  {YES/NO/WILD IDPOE:42353}  Do they still have interests or activities they enjor doing?  {YES/NO/WILD IRWER:15400}  How has their appetite been lately?  {Improving/worsening/no change:60406}  How has their sleep been lately?  {Symptoms; sleep quality:17851}  Problem  behaviors:  {alzheimers behavior:18002}   Compared to 5 to 10 years ago, how is the patient at:  Problems with Judgment, e.g., problem making decisions, bad financial decisions, problems with thinking?  {YES/NO/WILD QQPYP:95093}; {Improving/worsening/no change:60406}   Less interested in hobbies or previously enjoyed activities?  {{YES/NO/WILD OIZTI:45809}; Improving/worsening/no change:60406}   Problem remembering things about family and friends e.g. names,  occupations, birthdays, addresses?  {{YES/NO/WILD XIPJA:25053}; Improving/worsening/no change:60406}  Problem remembering conversations or news events a few days later?  {YES/NO/WILD ZJQBH:41937}; {Improving/worsening/no change:60406}  Problem remembering what day and month it is? {YES/NO/WILD TKWIO:97353}; {Improving/worsening/no change:60406}  Problem with losing things?  {YES/NO/WILD GDJME:26834}; {Improving/worsening/no change:60406}  Problem learning to use a new gadget or machine around the house, e.g., cell phones, computer, microwave, remote control?  {YES/NO/WILD HDQQI:29798}; {Improving/worsening/no change:60406}  Problem with handling money for shopping?  {YES/NO/WILD XQJJH:41740}; {Improving/worsening/no change:60406}  Problem handling financial matters, e.g. their pension, checking, credit cards, dealing with the bank?  {YES/NO/WILD CXKGY:18563}; {Improving/worsening/no change:60406}  Problem with getting lost in familiar places?  {{YES/NO/WILD CARDS:18581}Improving/worsening/no change:60406}  Problem with asking the same questions repeatedly or telling the same story repeatedly to the same person(s)?  {YES/NO/WILD JSHFW:26378}; {YES/NO/WILD HYIFO:27741}   Has there been a change in their usual personality?  {YES/NO/WILD OINOM:76720}     Behavioral and Psychological Symptoms of Dementia (relevant or irrelevant): *** If relevant, address whether these symptoms are present:  1. Anxiety:                      Does the patient become upset when separated from the caregiver? {YES/NO/WILD NOBSJ:62836}                    Does he/she have any other signs of nervousness such as shortness of breath, sighing, being unable to relax, or feeling excessively tense?                                        {YES/NO/WILD CARDS:18581} 2.   Irritability/Agitation/Aggression:                     Does the patient become impatient, cranky, have difficulty waiting?  {YES/NO/WILD  ZOXWR:60454}CARDS:18581}                    Does the patient resist care like bathing, dressing, eating/feeding? {YES/NO/WILD UJWJX:91478}CARDS:18581}                    Does the patient strike others? {YES/NO/WILD CARDS:18581}                    3.   Depression/Dysphoria:                      Does the patient appear sad, tearful, withdrawn, disinterested? {YES/NO/WILD GNFAO:13086}CARDS:18581}                     Has patient lost interest in eating?  {YES/NO/WILD VHQIO:96295}CARDS:18581}.  Is the patient losing or gaining weight?  {YES/NO/WILD CARDS:18581} 4.   Delusions:                      Does the patient believe that others are stealing from them or planning to hurt them in some way? {YES/NO/WILD CARDS:18581} 5.   Hallucinations:                     Does the patient hearing voices or does he/she talk to people who are not there? {YES/NO/WILD MWUXL:24401}CARDS:18581}                    Does the patient seeing things that others do not see? {YES/NO/WILD CARDS:18581} 6.   Motor disturbances:                     Does the patient do the same thing over and over, like taking clothes out of drawers, pacing, or yelling repeatedly  {YES/NO/WILD CARDS:18581}                    Has patient gotten lost? {YES/NO/WILD CARDS:18581} 7.   Disinhibition:                     Does the patient seem to act impulsively, for example, talking to strangers as if he/she knows them? {YES/NO/WILD UUVOZ:36644}CARDS:18581}                    Is the patient verbally abusive to others? {YES/NO/WILD CARDS:18581} 8.   Nighttime issues:                      Is the patient frequently awakening at night or getting up too early? {YES/NO/WILD IHKVQ:25956}CARDS:18581}                    Does the patient wander about home at night? {YES/NO/WILD CARDS:18581}                    Has the patient wandered outside? {YES/NO/WILD LOVFI:43329}CARDS:18581}   Geriatric Depression Scale:  *** / 15    Outpatient Encounter Medications as of 02/26/2019  Medication Sig  . aspirin EC 81 MG tablet Take 1 tablet (81 mg total) by mouth daily.  . carvedilol (COREG) 3.125 MG tablet TAKE ONE TABLET BY MOUTH TWICE DAILY WITH A MEAL  . hydrALAZINE (APRESOLINE) 50 MG tablet Take 2 tablets (100 mg total) by mouth 3 (three) times daily.  . Melatonin 1 MG TABS Take 1 tablet by mouth daily.  . Multiple Vitamin (MULTIVITAMIN) tablet Take 1  tablet by mouth daily.  . sacubitril-valsartan (ENTRESTO) 97-103 MG Take 1 tablet by mouth 2 (two) times daily.  . traZODone (DESYREL) 50 MG tablet Take 0.5-1 tablets (25-50 mg total) by mouth at bedtime as needed for sleep.  . [DISCONTINUED] varenicline (CHANTIX PAK) 0.5 MG X 11 & 1 MG X 42 tablet Take one 0.5 mg tablet once daily for 3 days, then increase to one 0.5 mg twice daily for 4 days, then increase to one 1 mg tab twice a day.   No facility-administered encounter medications on file as of 02/26/2019.     History Patient Active Problem List   Diagnosis Date Noted  . Dilated aortic root (HCC) 05/15/2018    Priority: High  . Nonischemic cardiomyopathy (HCC) 03/05/2017    Priority: High  . Essential hypertension 02/26/2019    Priority: Medium  . History of uncontrolled hypertension 02/15/2017    Priority: Medium  . Tobacco abuse 02/15/2017    Priority: Medium  . History of prediabetes 02/26/2019    Priority: Low  . 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 Adjustment insomnia 09/10/2018  . Disordered sleep 12/05/2017   Past Medical History:  Diagnosis Date  . Acute on chronic combined systolic and diastolic CHF (congestive heart  failure) (HCC) 06/21/2017  . Chest pain 03/04/2017  . Dysuria 01/06/2018  . Family history of early CAD   . HTN (hypertension)   . Irregular cardiac rhythm 05/15/2017  . 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 Tobacco use    Past Surgical History:  Procedure Laterality Date  . MANDIBLE FRACTURE SURGERY     Family History  Problem Relation Age of Onset  . Heart failure Father   . CAD Father        4470's   Social History   Socioeconomic History  . Marital status: Married    Spouse name: Not on file  . Number of children: Not on file  . Years of education: 2911  . Highest education level: 11th grade  Occupational History  . Not on file  Social Needs  . Financial resource strain: Not on file  . Food insecurity    Worry: Not on file    Inability: Not on file  . Transportation needs    Medical: Not on file    Non-medical: Not on file  Tobacco Use  . Smoking status: Current Every Day Smoker    Packs/day: 0.30    Years: 40.00    Pack years: 12.00  . Smokeless tobacco: Never Used  Substance and Sexual Activity  . Alcohol use: Yes  . Drug use: No  . Sexual activity: Not on file  Lifestyle  . Physical activity    Days per week: Not on file    Minutes per session: Not on file  . Stress: Not on file  Relationships  . Social Musicianconnections    Talks on phone: Not on file    Gets together: Not on file    Attends religious service: Not on file    Active member of club or organization: Not on file    Attends meetings of clubs or organizations: Not on file    Relationship status: Not on file  Other Topics Concern  . Not on file  Social History Narrative  . Not on file      Cardiovascular Risk Factors: {CHL CARDIAC RISK FACTORS STRESS ZOXW:96045409}TEST:21021014}  Educational History: *** years formal education Personal History of Seizures: {question; yes no :20885} Personal History of Stroke: {question;  yes no :20885} Personal History of Head Trauma: {question; yes no :20885} Personal  History of Psychiatric Disorders: {question; yes no :20885} Family History of Dementia: {question; yes no :20885}     Basic Activities of Daily Living  Dressing: {ADL:18316::"Self-care"} Eating: {ADL:18316::"Self-care"} Ambulation: {ADL:18316::"Self-care"} Toileting: {ADL:18316::"Self-care"} Bathing: {ADL:18316::"Self-care"}  Instrumental Activities of Daily Living Shopping: {ADL:18316::"Self-care"} House/Yard Work: {ADL:18316::"Self-care"} Administration of medications: {ADL:18316::"Self-care"} Finances: {ADL:18316::"Self-care"} Telephone: {ADL:18316::"Self-care"} Transportation: {ADL:18316::"Self-care"}   Caregivers in home: {relatives - adult:5061::"spouse"}  Caregiver Stress Self-Assessment (Zarit Score):  *** out of 80 Scoring: 0-20 = Little/No stress       21-40 = Mild/Moderate stress       41-60 = Moderate/Severe stress      61-80 = Severe stress   Formal Home Health Assistance  Physical Therapy: {YES/NO/WILD ZOXWR:60454}  Occupational Therapy: {YES/NO/WILD UJWJX:91478}             Home Aid / Personal Care Service: {YES/NO/WILD GNFAO:13086}             Homemaker services: {YES/NO/WILD VHQIO:96295}  FALLS in last five office visits:  Fall Risk  01/20/2019 12/04/2017 06/25/2017  Falls in the past year? 0 No No    Health Maintenance reviewed: Immunization History  Administered Date(s) Administered  . Tdap 02/15/2017   The patient has no Health Maintenance topics of status Overdue, Due On, or Due Soon  Diet: {DIET STATUS:21022986} Nutritional supplements: {NUTRITION SUPPLEMENTS:220002}  Geriatric Syndromes: Constipation {YES NO:22349}  Laxative use:{YES NO:22349}   Incontinence {YES NO:22349}  Nocturia: {YES NO:22349} Dizziness {YES NO:22349}   Syncope {YES NO:22349}  Balance impairment:{YES NO:22349}    Skin problems {YES NO:22349}   Visual Impairment {YES NO:22349}   Hearing impairment {YES NO:22349} Dentures problems: {YES NO:22349} Dry mouth: {YES  NO:22349}  Eating impairment {YES NO:22349}  Impaired Memory or Cognition {YES NO:22349}   Behavioral problems {YES NO:22349}   Sleep problems {YES NO:22349}   Weight loss {YES NO:22349} Drug Misadventure: {YES/NO/WILD CARDS:18581}   Joint pain: {YES NO:22349} Joint stiffness: {YES NO:22349} Osteoporosis: {YES/NO/WILD CARDS:18581} Pressure Ulcers: {YES/NO/WILD CARDS:18581} Immobility: {YES/NO/WILD CARDS:18581} Ankle edema: {YES NO:22349} History of UTIs: {YES/NO/WILD CARDS:18581}  ROS Denies fevers/chills; denies changes in appetite; denies changes in weight;  Denies changes in vision / hearing / smell / taste; Denies runny nose / ear pain or discharge / sore throat / sinus congestion / cough/w phlegm; Denies chest congestion / wheezing;  Denies chest pain; denies heart beating slower/thumps inside chest; denies racing heart/flutter; Denies dysuria; denies hematuria;  Denies constipation; denies melena/hematochezia; denies diarrhea;  Denies abdominal discomfort/gaseous bloating; denies N/V; denies heart burn;  Denies recent falls/unsteady gait;  Denies unilateral weakness / clumsiness / tingling / numbness; denies tremors;  Denies sadness / anxiety / suicidal tendencies  Vital Signs   There is no height or weight on file to calculate BMI. CrCl cannot be calculated (Patient's most recent lab result is older than the maximum 21 days allowed.). There is no height or weight on file to calculate BSA. There were no vitals filed for this visit. Wt Readings from Last 3 Encounters:  01/19/19 181 lb (82.1 kg)  12/18/18 180 lb (81.6 kg)  12/09/18 172 lb (78 kg)   No exam data present  Physical Examination:  VS reviewed GEN: Alert, Cooperative, Groomed, NAD HEENT: PERRL; EAC bilaterally not occluded, TM's translucent with normal LM, (+) LR;                No cervical LAN, No thyromegaly, No palpable masses COR: RRR, No M/G/R, No  JVD, Normal PMI size and location LUNGS: BCTA, No Acc  mm use, speaking in full sentences ABDOMEN: (+)BS, soft, NT, ND, No HSM, No palpable masses GU: Normal Rectal tone, no palpable masses, prostate without hypertrophy/asymmetry/nodularity. Hemoccult negative. EXT: No peripheral leg edema. Feet without deformity or lesions. Palpable bilateral pedal pulses.  SKIN: No lesion nor rashes of face/trunk/extremities Neuro: Oriented to person, place, and time; Strength: 5/5 Bil. UE and LE symmetric; Sensation: Intact grossly to touch all four extremities; Cerebellar: Finger-to-Nose intact, Rhomberg negative; Muscle Tone normal; Tremor not present; DTR: Bilateral Bicep 2+, Bilateral Triceps 2+, Bilateral Knees 2+, Bilateral Ankles 1+ Gait: No significant path deviation, Step-through present  Psych: Normal affect/thought/speech/language  Timed Up & Go Test: *** seconds Sit-to-Stand Test: *** / 30-seconds 4-Stage Stand Test:  Feet Side-by-side: {yes no:314532}     Feet Semi-tandem: {yes no:314532}     Feet Tandem: {yes no:314532}     One-Foot Stand:  {yes no:314532}   Mini-Mental State Examination or Montreal Cognitive Assessment:  Patient {Desc; did/not:3044021} require additional cues or prompts to complete tasks. Patient {WAS/WAS NOT:[phone]::"was not"} cooperative and attentive to testing tasks Patient {Desc; did/not:3044021} appear motivated to perform well  No flowsheet data found.      No flowsheet data found.    Labs No components found for: VITAMIND  No results found for: VITAMINB12  Lab Results  Component Value Date   FOLATE 17.5 09/10/2018    Lab Results  Component Value Date   TSH 0.857 09/10/2018    No results found for: RPR  Lab Results  Component Value Date   HIV Non Reactive 09/10/2018      Chemistry      Component Value Date/Time   NA 142 12/15/2018 1123   K 4.4 12/15/2018 1123   CL 104 12/15/2018 1123   CO2 20 12/15/2018 1123   BUN 21 12/15/2018 1123   CREATININE 1.24 12/15/2018 1123      Component  Value Date/Time   CALCIUM 9.4 12/15/2018 1123   ALKPHOS 103 09/10/2018 1102   AST 18 09/10/2018 1102   ALT 19 09/10/2018 1102   BILITOT 0.3 09/10/2018 1102       Lab Results  Component Value Date   HGBA1C 6.1 (H) 03/04/2017     @10RELATIVEDAYS @No  exam data present Lab Results  Component Value Date   WBC 6.0 09/10/2018   HGB 12.8 (L) 09/10/2018   HCT 40.9 09/10/2018   MCV 77 (L) 09/10/2018   PLT 238 09/10/2018    No results found for this or any previous visit (from the past 24 hour(s)).  Imaging Head CT: {Time; dates multiple:304500300}:   Brain MRI: {Time; dates multiple:304500300}:     Personal Strengths {PATIENT STRENGTHS:22666}  Support System Strengths {Patient Coping [phone]}   Advanced Directives Code Status: *** Advance Directives: *** {Palliative Code status:23503}   ------------------------------------------------------------------------------------------------------------------------------------------------------------------------------------------------------------------------------------------------------------------------------------------------------------------------------------------------------------------------------------------------------------------------------------------------------------------------------------------------------------  Assessment and Plan: Please see individual consultation notes from physical therapy, pharmacy and social work for today.    Problem List Items Addressed This Visit      Medium   Essential hypertension     Low   History of prediabetes     Unprioritized   Memory loss of unknown cause - Primary     No problem-specific Assessment & Plan notes found for this encounter.    Patient [facility]  Dr.*** [facility] in {NUMBER 1-10:22536} {Time; day/wk/mo/yr(s):9076}  > 60 minutes face to face were spent in total with interdisciplinary discussion,  patient and caretaker counseling and coordination of care took more than 20 minutes. The Geriatric interdisciplinary team meet to discuss the patient's assessment, problem list, and recommendations.  The interdisciplinary team consisted of representatives from medicine, pharmacy, physical therapy and social work. The interdisciplinary team meet with the patient and caretakers to review the team's findings, assessments, and recommendations.

## 2019-03-05 ENCOUNTER — Encounter (HOSPITAL_COMMUNITY): Payer: Self-pay | Admitting: Emergency Medicine

## 2019-03-05 ENCOUNTER — Other Ambulatory Visit: Payer: Self-pay

## 2019-03-05 ENCOUNTER — Emergency Department (HOSPITAL_COMMUNITY)
Admission: EM | Admit: 2019-03-05 | Discharge: 2019-03-05 | Disposition: A | Discharge status: Home / Self Care | Payer: BC Managed Care – PPO | Attending: Emergency Medicine | Admitting: Emergency Medicine

## 2019-03-05 DIAGNOSIS — T7840XA Allergy, unspecified, initial encounter: Secondary | ICD-10-CM | POA: Diagnosis not present

## 2019-03-05 DIAGNOSIS — Z7982 Long term (current) use of aspirin: Secondary | ICD-10-CM | POA: Diagnosis not present

## 2019-03-05 DIAGNOSIS — I10 Essential (primary) hypertension: Secondary | ICD-10-CM | POA: Insufficient documentation

## 2019-03-05 DIAGNOSIS — Z79899 Other long term (current) drug therapy: Secondary | ICD-10-CM | POA: Insufficient documentation

## 2019-03-05 DIAGNOSIS — F17210 Nicotine dependence, cigarettes, uncomplicated: Secondary | ICD-10-CM | POA: Insufficient documentation

## 2019-03-05 DIAGNOSIS — L509 Urticaria, unspecified: Secondary | ICD-10-CM | POA: Diagnosis present

## 2019-03-05 MED ORDER — DIPHENHYDRAMINE HCL 25 MG PO CAPS
25.0000 mg | ORAL_CAPSULE | Freq: Once | ORAL | Status: AC
  Administered 2019-03-05: 17:00:00 25 mg via ORAL
  Filled 2019-03-05: qty 1

## 2019-03-05 MED ORDER — DIPHENHYDRAMINE HCL 25 MG PO TABS: 25.0000 mg | ORAL_TABLET | Freq: Three times a day (TID) | ORAL | 0 refills | Status: DC | PRN

## 2019-03-05 MED ORDER — FAMOTIDINE 20 MG PO TABS: 20.0000 mg | ORAL_TABLET | Freq: Two times a day (BID) | ORAL | 0 refills | Status: AC

## 2019-03-05 MED ORDER — PREDNISONE 20 MG PO TABS: 40.0000 mg | ORAL_TABLET | Freq: Every day | ORAL | 0 refills | Status: AC

## 2019-03-05 MED ORDER — FAMOTIDINE 20 MG PO TABS
20.0000 mg | ORAL_TABLET | Freq: Once | ORAL | Status: AC
  Administered 2019-03-05: 20 mg via ORAL
  Filled 2019-03-05: qty 1

## 2019-03-05 MED ORDER — PREDNISONE 20 MG PO TABS
60.0000 mg | ORAL_TABLET | Freq: Once | ORAL | Status: AC
  Administered 2019-03-05: 60 mg via ORAL
  Filled 2019-03-05: qty 3

## 2019-03-05 NOTE — ED Provider Notes (Signed)
[facility]   CSN: [id number] Arrival date & time: 03/05/19  1641    History   Chief Complaint Chief Complaint  Patient presents with   Allergic Reaction    HPI Charm BargesStephen Straw is a 58 y.o. male with past medical history significant for hypertension, CHF, tobacco abuse who presents for evaluation of hives.  Patient states he works at [employer] and they were using chemicals to clean the carpets.  Patient states he developed pruritus to his right upper chest, shoulder, left flank, abdomen and left thigh.  Patient denies prior history of allergic reactions or anaphylaxis.  Patient states this began about 20 minutes after his exposure at work.  He did try Benadryl cream which help with his pruritus however the lesions have not dissipated.  Denies fever, chills sensation of throat closing, cough, shortness of breath, oral lesions, chest pain, abdominal pain, nausea, vomiting, diarrhea, decreased range of motion, numbness or tingling in his extremities, redness or warmth of his extremities.  Denies additional aggravating or alleviating factors.  History obtained from patient and past medical records.  No interpreter was used.     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 Authorizing Provider  aspirin EC 81 MG tablet Take 1 tablet (81 mg total) by mouth daily. 05/15/18   Hilty, Lisette AbuKenneth C, MD  carvedilol (COREG) 3.125 MG tablet TAKE ONE TABLET BY MOUTH TWICE DAILY WITH A MEAL 06/24/18   Hilty, Lisette AbuKenneth C, MD  diphenhydrAMINE (BENADRYL) 25 MG tablet Take 1 tablet (25 mg total) by mouth every 8 (eight) hours as needed. 03/05/19   Erza Mothershead A, PA-C  famotidine (PEPCID) 20 MG tablet Take 1 tablet (20 mg total) by mouth 2 (two) times daily. 03/05/19   Cyndi Montejano A, PA-C  hydrALAZINE (APRESOLINE) 50 MG tablet Take 2 tablets (100 mg total) by mouth 3 (three) times daily. 01/20/19   Garth Bignessimberlake, Kathryn, MD  Melatonin 1 MG TABS Take 1 tablet by mouth daily.    [provider]  Multiple Vitamin (MULTIVITAMIN) tablet Take 1 tablet by mouth daily.    [provider]  predniSONE (DELTASONE) 20 MG tablet Take 2 tablets (40 mg total) by mouth daily for 3 days. 03/05/19 03/08/19  Calbert Hulsebus A, PA-C  sacubitril-valsartan (ENTRESTO) 97-103 MG Take 1 tablet by mouth 2 (two) times daily. 12/10/18   Jodelle GrossLawrence, Kathryn M, NP  traZODone (DESYREL) 50 MG tablet Take 0.5-1 tablets (25-50 mg total) by mouth at bedtime as needed for sleep. 09/10/18   Garth Bignessimberlake, Kathryn, MD  Family History Family History  Problem Relation Age of Onset   Heart failure Father    CAD Father        75's    Social History Social History   Tobacco Use   Smoking status: Current Every Day Smoker    Packs/day: 0.30    Years: 40.00    Pack years: 12.00   Smokeless tobacco: Never Used  Substance Use Topics   Alcohol use: Yes   Drug use: No     Allergies   Patient has no known allergies.   Review of Systems Review of Systems  Constitutional:  Negative.   HENT: Negative.   Respiratory: Negative.   Cardiovascular: Negative.   Gastrointestinal: Negative.   Genitourinary: Negative.   Musculoskeletal: Negative.   Skin: Positive for rash.  Neurological: Negative.   All other systems reviewed and are negative.    Physical Exam Updated Vital Signs BP (!) 135/99    Pulse 65    Temp 98.7 F (37.1 C) (Oral)    Resp 16    SpO2 98%   Physical Exam Vitals signs and nursing note reviewed.  Constitutional:      General: He is not in acute distress.    Appearance: He is not ill-appearing, toxic-appearing or diaphoretic.  HENT:     Head: Normocephalic and atraumatic.     Jaw: There is normal jaw occlusion.     Comments: No facial swelling.  No swelling to lips or gingiva.    Nose:     Comments: Clear rhinorrhea and congestion to bilateral nares.  No sinus tenderness.    Mouth/Throat:     Comments: Posterior oropharynx clear.  Mucous membranes moist.  Tonsils without erythema or exudate.  Uvula midline without deviation.  No evidence of PTA or RPA.  No drooling, dysphasia or trismus.  Phonation normal.  No oral lesions. Neck:     Trachea: Trachea and phonation normal.     Meningeal: Brudzinski's sign and Kernig's sign absent.     Comments: No Neck stiffness or neck rigidity.  No meningismus.  No cervical lymphadenopathy. Cardiovascular:     Comments: No murmurs rubs or gallops. Pulmonary:     Comments: Clear to auscultation bilaterally without wheeze, rhonchi or rales.  No accessory muscle usage.  Able speak in full sentences. Abdominal:     Comments: Soft, nontender without rebound or guarding.  No CVA tenderness.  Musculoskeletal:     Comments: Moves all 4 extremities without difficulty.  Lower extremities without edema, erythema or warmth.  Skin:    Comments: Brisk capillary refill.  Erythematous urticaria to right anterior shoulder, right upper chest, left flank, left abdomen, left anterior thigh.  No bulla, target lesions,  vesicles, induration or fluctuance.  No lacerations or contusions.  Neurological:     Mental Status: He is alert.     Comments: Ambulatory in department without difficulty.  Cranial nerves II through XII grossly intact.  No facial droop.  No aphasia.    ED Treatments / Results  Labs (all labs ordered are listed, but only abnormal results are displayed) Labs Reviewed - No data to display  EKG None  Radiology No results found.  Procedures Procedures (including critical care time)  Medications Ordered in ED Medications  diphenhydrAMINE (BENADRYL) capsule 25 mg (25 mg Oral Given 03/05/19 1729)  predniSONE (DELTASONE) tablet 60 mg (60 mg Oral Given 03/05/19 1729)  famotidine (PEPCID) tablet 20 mg (20 mg Oral Given 03/05/19 1729)     Initial  Impression / Assessment and Plan / ED Course  I have reviewed the triage vital signs and the nursing notes.  Pertinent labs & imaging results that were available during my care of the patient were reviewed by me and considered in my medical decision making (see chart for details).  59 year old male appears otherwise well presents for evaluation of allergic reaction.  He is afebrile, nonseptic, non-ill-appearing.  Patient with multiple areas of erythematous urticaria to trunk and left lower extremity.  Heart and lungs clear.  No oral lesions.  No stridor. No tachycardia, tachypnea or hypoxia. Patient denies any difficulty breathing or swallowing.  Pt has a patent airway without stridor and is handling secretions without difficulty; no angioedema. No blisters, no pustules, no warmth, no draining sinus tracts, no superficial abscesses, no bullous impetigo, no vesicles, no desquamation, no target lesions with dusky purpura or a central bulla. Not tender to touch. No concern for superimposed infection. No concern for SJS, TEN, TSS, tick borne illness, syphilis or other life-threatening condition. Will discharge home with short course of steroids, pepcid and  recommend Benadryl as needed for pruritis.  Patient re-evaluated prior to dc, is hemodynamically stable, in no respiratory distress, and denies the feeling of throat closing. Pt has been advised to take OTC benadryl & return to the ED if they have a mod-severe allergic rxn (s/s including throat closing, difficulty breathing, swelling of lips face or tongue). Pt is to follow up with their PCP. Patient observed for almost 3 hours. Requesting dc home. No sx of anaphylaxis.  The patient has been appropriately medically screened and/or stabilized in the ED. I have low suspicion for any other emergent medical condition which would require further screening, evaluation or treatment in the ED or require inpatient management.  Patient is hemodynamically stable and in no acute distress.  Patient able to ambulate in department prior to ED.  Evaluation does not show acute pathology that would require ongoing or additional emergent interventions while in the emergency department or further inpatient treatment.  I have discussed the diagnosis with the patient and answered all questions.  Pain is been managed while in the emergency department and patient has no further complaints prior to discharge.  Patient is comfortable with plan discussed in room and is stable for discharge at this time.  I have discussed strict return precautions for returning to the emergency department.  Patient was encouraged to follow-up with PCP/specialist refer to at discharge.     Final Clinical Impressions(s) / ED Diagnoses   Final diagnoses:  Allergic reaction, initial encounter    ED Discharge Orders         Ordered    diphenhydrAMINE (BENADRYL) 25 MG tablet  Every 8 hours PRN     03/05/19 1925    famotidine (PEPCID) 20 MG tablet  2 times daily     03/05/19 1925    predniSONE (DELTASONE) 20 MG tablet  Daily     03/05/19 1925           Nettie Elm, PA-C 03/05/19 Merita Norton, MD 03/06/19 [phone]

## 2019-03-05 NOTE — ED Triage Notes (Signed)
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  Hives noted to right leg and left abdomen.  Denies any resp. Distress.

## 2019-03-05 NOTE — ED Notes (Signed)
Discharge instructions and prescriptions discussed with Pt. Pt verbalized understanding. Pt stable and ambulatory.   

## 2019-03-05 NOTE — Discharge Instructions (Signed)
Take the medicines as prescribed.  Return for any new or worsening symptoms, specifically shortness of breath.

## 2019-03-17 ENCOUNTER — Ambulatory Visit (HOSPITAL_COMMUNITY): Payer: BC Managed Care – PPO | Attending: Internal Medicine

## 2019-03-25 ENCOUNTER — Encounter (HOSPITAL_COMMUNITY): Payer: Self-pay | Admitting: Internal Medicine

## 2019-03-31 ENCOUNTER — Telehealth (HOSPITAL_COMMUNITY): Payer: Self-pay

## 2019-03-31 NOTE — Telephone Encounter (Signed)
New message   Just an FYI. We have made several attempts to contact this patient including sending a letter to schedule or reschedule their echocardiogram. We will be removing the patient from the echo WQ.   Thank you  8.5.20 mail reminder letter Jason Pitts  7.28.20 no show  11.13.2019 @ 2:01pm lm on home vm Jason Pitts   11.7.2019 @ 1;58pm lm on home vm / schedule echo Jason Pitts  10.24.2019 @ 2:32pm lm on home vm / schedule echo Jason Pitts

## 2019-04-13 ENCOUNTER — Encounter (HOSPITAL_COMMUNITY): Payer: Self-pay

## 2019-04-13 ENCOUNTER — Emergency Department (HOSPITAL_COMMUNITY)
Admission: EM | Admit: 2019-04-13 | Discharge: 2019-04-13 | Disposition: A | Discharge status: Home / Self Care | Payer: BC Managed Care – PPO | Attending: Emergency Medicine | Admitting: Emergency Medicine

## 2019-04-13 ENCOUNTER — Emergency Department (HOSPITAL_COMMUNITY): Payer: BC Managed Care – PPO

## 2019-04-13 ENCOUNTER — Other Ambulatory Visit: Payer: Self-pay

## 2019-04-13 DIAGNOSIS — Z7982 Long term (current) use of aspirin: Secondary | ICD-10-CM | POA: Diagnosis not present

## 2019-04-13 DIAGNOSIS — F17200 Nicotine dependence, unspecified, uncomplicated: Secondary | ICD-10-CM | POA: Insufficient documentation

## 2019-04-13 DIAGNOSIS — T679XXA Effect of heat and light, unspecified, initial encounter: Secondary | ICD-10-CM

## 2019-04-13 DIAGNOSIS — I5042 Chronic combined systolic (congestive) and diastolic (congestive) heart failure: Secondary | ICD-10-CM | POA: Diagnosis not present

## 2019-04-13 DIAGNOSIS — Z79899 Other long term (current) drug therapy: Secondary | ICD-10-CM | POA: Diagnosis not present

## 2019-04-13 DIAGNOSIS — R1110 Vomiting, unspecified: Secondary | ICD-10-CM | POA: Insufficient documentation

## 2019-04-13 DIAGNOSIS — I110 Hypertensive heart disease with heart failure: Secondary | ICD-10-CM | POA: Insufficient documentation

## 2019-04-13 DIAGNOSIS — R51 Headache: Secondary | ICD-10-CM | POA: Diagnosis not present

## 2019-04-13 DIAGNOSIS — X30XXXA Exposure to excessive natural heat, initial encounter: Secondary | ICD-10-CM | POA: Diagnosis not present

## 2019-04-13 LAB — COMPREHENSIVE METABOLIC PANEL
ALT: 25 U/L (ref 0–44)
AST: 32 U/L (ref 15–41)
Albumin: 3.9 g/dL (ref 3.5–5.0)
Alkaline Phosphatase: 82 U/L (ref 38–126)
Anion gap: 8 (ref 5–15)
BUN: 23 mg/dL — ABNORMAL HIGH (ref 6–20)
CO2: 24 mmol/L (ref 22–32)
Calcium: 8.7 mg/dL — ABNORMAL LOW (ref 8.9–10.3)
Chloride: 108 mmol/L (ref 98–111)
Creatinine, Ser: 1.43 mg/dL — ABNORMAL HIGH (ref 0.61–1.24)
GFR calc Af Amer: >60 mL/min (ref >60)
GFR calc non Af Amer: 53 mL/min — ABNORMAL LOW (ref >60)
Glucose, Bld: 122 mg/dL — ABNORMAL HIGH (ref 70–99)
Potassium: 3.6 mmol/L (ref 3.5–5.1)
Sodium: 140 mmol/L (ref 135–145)
Total Bilirubin: 0.5 mg/dL (ref 0.3–1.2)
Total Protein: 7 g/dL (ref 6.5–8.1)

## 2019-04-13 LAB — CBC WITH DIFFERENTIAL/PLATELET
Abs Immature Granulocytes: 0.02 10*3/uL (ref 0.00–0.07)
Basophils Absolute: 0 10*3/uL (ref 0.0–0.1)
Basophils Relative: 0 %
Eosinophils Absolute: 0.1 10*3/uL (ref 0.0–0.5)
Eosinophils Relative: 1 %
HCT: 41.6 % (ref 39.0–52.0)
Hemoglobin: 12.9 g/dL — ABNORMAL LOW (ref 13.0–17.0)
Immature Granulocytes: 0 %
Lymphocytes Relative: 25 %
Lymphs Abs: 1.9 10*3/uL (ref 0.7–4.0)
MCH: 24.9 pg — ABNORMAL LOW (ref 26.0–34.0)
MCHC: 31 g/dL (ref 30.0–36.0)
MCV: 80.2 fL (ref 80.0–100.0)
Monocytes Absolute: 0.5 10*3/uL (ref 0.1–1.0)
Monocytes Relative: 7 %
Neutro Abs: 5.1 10*3/uL (ref 1.7–7.7)
Neutrophils Relative %: 67 %
Platelets: 231 10*3/uL (ref 150–400)
RBC: 5.19 MIL/uL (ref 4.22–5.81)
RDW: 15.8 % — ABNORMAL HIGH (ref 11.5–15.5)
WBC: 7.7 10*3/uL (ref 4.0–10.5)
nRBC: 0 % (ref 0.0–0.2)

## 2019-04-13 LAB — LIPASE, BLOOD: Lipase: 31 U/L (ref 11–51)

## 2019-04-13 LAB — PROTIME-INR
INR: 1 (ref 0.8–1.2)
Prothrombin Time: 13.4 seconds (ref 11.4–15.2)

## 2019-04-13 LAB — ETHANOL: Alcohol, Ethyl (B): <10 mg/dL (ref <10)

## 2019-04-13 LAB — TSH: TSH: 0.896 u[IU]/mL (ref 0.350–4.500)

## 2019-04-13 MED ORDER — ACETAMINOPHEN 500 MG PO TABS
1000.0000 mg | ORAL_TABLET | Freq: Once | ORAL | Status: AC
  Administered 2019-04-13: 1000 mg via ORAL
  Filled 2019-04-13: qty 2

## 2019-04-13 MED ORDER — SODIUM CHLORIDE 0.9 % IV BOLUS
500.0000 mL | Freq: Once | INTRAVENOUS | Status: AC
  Administered 2019-04-13: 16:00:00 500 mL via INTRAVENOUS

## 2019-04-13 NOTE — ED Provider Notes (Signed)
4:55 PM-checkout to evaluate patient for symptomatic heat exposure while at work today.  Case discussed with Dr. Francia Greaves.  Chart reviewed.   Clinical Course as of Apr 12 2038  Mon Apr 13, 2019  1826 Normal  Lipase, blood [EW]  1826 Normal  Ethanol [EW]  1826 Except hemoglobin slightly low  CBC with Differential(!) [EW]  1826 Normal except elevated glucose, BUN, creatinine.  Calcium slightly low.  GFR low.   [EW]    Clinical Course User Index [EW] Daleen Bo, MD     Patient Vitals for the past 24 hrs:  BP Temp Temp src Pulse Resp SpO2  04/13/19 2000 126/81 - - (!) 49 15 96 %  04/13/19 1930 (!) 150/83 - - 60 - 99 %  04/13/19 1900 (!) 160/93 97.8 F (36.6 C) Oral 60 18 95 %  04/13/19 1830 (!) 154/104 - - 63 18 98 %  04/13/19 1800 (!) 145/102 - - 68 (!) 8 96 %  04/13/19 1730 (!) 170/106 - - (!) 59 18 95 %  04/13/19 1700 (!) 166/111 - - 60 18 95 %  04/13/19 1630 (!) 183/118 - - 60 18 99 %  04/13/19 1600 (!) 221/158 - - 79 (!) 36 98 %  04/13/19 1530 (!) 199/118 - - (!) 48 17 92 %    8:36 PM Reevaluation with update and discussion. After initial assessment and treatment, an updated evaluation reveals patient alert, comfortable he has been able to eat and drink here.  Blood pressure is now 126/81, normal without specific treatment.  Reviewed history with patient no changes from prior earlier history gained.  Patient is comfortable and ready to go home.  He states he wants to take a week off work to help himself get better.  He does describe having some stress at home with his wife who is disabled, and worry about death of parent, 8 months ago.  Findings discussed and questions answered.Daleen Bo   Medical Decision Making: Nonspecific vomiting, after heat exposure.  Initial blood pressure elevation, improved spontaneously.  Laboratory evaluation consistent with mild acute kidney injury, as compared with prior, creatinine and BUN are elevated.  TSH ordered to follow-up as an  outpatient with result, after seeing PCP.  Doubt CVA, metabolic instability or impending vascular collapse.  CRITICAL CARE-no Performed by: Daleen Bo  Nursing Notes Reviewed/ Care Coordinated Applicable Imaging Reviewed Interpretation of Laboratory Data incorporated into ED treatment  The patient appears reasonably screened and/or stabilized for discharge and I doubt any other medical condition or other [facility] requiring further screening, evaluation, or treatment in the ED at this time prior to discharge.  Plan: Home Medications-continue usual; Home Treatments-rest, fluids, avoid heat; return here if the recommended treatment, does not improve the symptoms; Recommended follow up-PCP follow-up 1 week and as needed.    Daleen Bo, MD 04/13/19 2039

## 2019-04-13 NOTE — ED Triage Notes (Signed)
Pt BIB EMS from [place] where pt works. Pt c/o lightheadedness, cramping. Pt has been working outside for most of today. Upon arrival pt c/o of weakness and headache. Pt c/o vomiting.   Zofran 4 mg and 150 mL given by EMS  18 L AC  174/113 64 97% RA 98.6 temp  CBG 116

## 2019-04-13 NOTE — ED Notes (Addendum)
An After Visit Summary was printed and given to the patient. Discharge instructions given and no further questions at this time. Pt leaving with ginger ale and crackers, pt states he feels queasy but better than upon arrival. Pt states his friend is taking him home.

## 2019-04-13 NOTE — ED Notes (Signed)
Main lab is going to add TSH to gold top tube already in the lab.

## 2019-04-13 NOTE — ED Notes (Signed)
Pt able to tolerate PO fluids and crackers 

## 2019-04-13 NOTE — ED Provider Notes (Addendum)
[facility]-EMERGENCY DEPT Provider Note   CSN: [id number] Arrival date & time: 04/13/19  1448     History   Chief Complaint Chief Complaint  Patient presents with  . Emesis  . Headache  . Heat Exposure    HPI Kevonta Sprehe is a 58 y.o. male.     58 year old male with prior medical history as detailed below presents for evaluation of headache, nausea, vomiting, and possible heat exhaustion.  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  He has been working outside all day.  He felt that the heat was "too much."  Patient was reportedly drenched with sweat upon EMS initial evaluation.  Patient reports that he is feeling better now after being removed from heat and getting IV fluids.  The history is provided by the patient and medical records.  Illness Location:  Sweaty, weak, headache  Severity:  Mild Onset quality:  Gradual Duration:  4 hours Timing:  Constant Progression:  Waxing and waning Chronicity:  New Associated symptoms: headaches     Past Medical History:  Diagnosis Date  . Acute on chronic combined systolic and diastolic CHF (congestive heart failure) (HCC) 06/21/2017  . Chest pain 03/04/2017  . Dysuria 01/06/2018  . Family history of early CAD   . HTN (hypertension)   . Irregular cardiac rhythm 05/15/2017  . 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 Tobacco use     Patient Active Problem List   Diagnosis Date Noted  . Essential hypertension 02/26/2019  . History of prediabetes 02/26/2019  . 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 Adjustment insomnia 09/10/2018  . Dilated aortic root (HCC) 05/15/2018  . Disordered sleep 12/05/2017  . Nonischemic cardiomyopathy (HCC) 03/05/2017  . History of uncontrolled hypertension 02/15/2017  . Tobacco abuse 02/15/2017    Past Surgical History:  Procedure Laterality Date  . MANDIBLE FRACTURE SURGERY          Home Medications    Prior to Admission medications    Medication Sig Start Date End Date Taking? Authorizing Provider  aspirin EC 81 MG tablet Take 1 tablet (81 mg total) by mouth daily. 05/15/18   Hilty, Lisette Abu, MD  carvedilol (COREG) 3.125 MG tablet TAKE ONE TABLET BY MOUTH TWICE DAILY WITH A MEAL 06/24/18   Hilty, Lisette Abu, MD  diphenhydrAMINE (BENADRYL) 25 MG tablet Take 1 tablet (25 mg total) by mouth every 8 (eight) hours as needed. 03/05/19   Henderly, Britni A, PA-C  famotidine (PEPCID) 20 MG tablet Take 1 tablet (20 mg total) by mouth 2 (two) times daily. 03/05/19   Henderly, Britni A, PA-C  hydrALAZINE (APRESOLINE) 50 MG tablet Take 2 tablets (100 mg total) by mouth 3 (three) times daily. 01/20/19   Shon Hale, MD  Melatonin 1 MG TABS Take 1 tablet by mouth daily.    [provider]  Multiple Vitamin (MULTIVITAMIN) tablet Take 1 tablet by mouth daily.    [provider]  sacubitril-valsartan (ENTRESTO) 97-103 MG Take 1 tablet by mouth 2 (two) times daily. 12/10/18   Jodelle Gross, NP  traZODone (DESYREL) 50 MG tablet Take 0.5-1 tablets (25-50 mg total) by mouth at bedtime as needed for sleep. 09/10/18   Shon Hale, MD    Family History Family History  Problem Relation Age of Onset  . Heart failure Father   . CAD Father        41's    Social History Social History   Tobacco Use  .  Smoking status: Current Every Day Smoker    Packs/day: 0.30    Years: 40.00    Pack years: 12.00  . Smokeless tobacco: Never Used  Substance Use Topics  . Alcohol use: Yes  . Drug use: No     Allergies   Patient has no known allergies.   Review of Systems Review of Systems  Neurological: Positive for headaches.  All other systems reviewed and are negative.    Physical Exam Updated Vital Signs There were no vitals taken for this visit.  Physical Exam Vitals signs and nursing note reviewed.  Constitutional:      General: He is not in acute distress.    Appearance: He is well-developed.   HENT:     Head: Normocephalic and atraumatic.  Eyes:     General: No visual field deficit.    Conjunctiva/sclera: Conjunctivae normal.     Pupils: Pupils are equal, round, and reactive to light.  Neck:     Musculoskeletal: Normal range of motion and neck supple.  Cardiovascular:     Rate and Rhythm: Normal rate and regular rhythm.     Heart sounds: Normal heart sounds.  Pulmonary:     Effort: Pulmonary effort is normal. No respiratory distress.     Breath sounds: Normal breath sounds.  Abdominal:     General: There is no distension.     Palpations: Abdomen is soft.     Tenderness: There is no abdominal tenderness.  Musculoskeletal: Normal range of motion.        General: No deformity.  Skin:    General: Skin is warm and dry.  Neurological:     Mental Status: He is alert and oriented to person, place, and time. Mental status is at baseline.     GCS: GCS eye subscore is 4. GCS verbal subscore is 5. GCS motor subscore is 6.     Cranial Nerves: No cranial nerve deficit, dysarthria or facial asymmetry.      ED Treatments / Results  Labs (all labs ordered are listed, but only abnormal results are displayed) Labs Reviewed  COMPREHENSIVE METABOLIC PANEL  ETHANOL  CBC WITH DIFFERENTIAL/PLATELET  PROTIME-INR  LIPASE, BLOOD    EKG EKG Interpretation  Date/Time:  Monday April 13 2019 15:49:59 EDT Ventricular Rate:  67 PR Interval:    QRS Duration: 120 QT Interval:  482 QTC Calculation: 509 R Axis:   41 Text Interpretation:  Sinus rhythm Ventricular premature complex Prolonged PR interval Nonspecific intraventricular conduction delay Probable anteroseptal infarct, recent Lateral leads are also involved Confirmed by Kristine RoyalMessick, Peter [phone]([id number]) on 04/13/2019 4:07:21 PM   Radiology No results found.  Procedures Procedures (including critical care time)  Medications Ordered in ED Medications  sodium chloride 0.9 % bolus 500 mL (500 mLs Intravenous Bolus from Bag 04/13/19 1530)   acetaminophen (TYLENOL) tablet 1,000 mg (1,000 mg Oral Given 04/13/19 1529)     Initial Impression / Assessment and Plan / ED Course  I have reviewed the triage vital signs and the nursing notes.  Pertinent labs & imaging results that were available during my care of the patient were reviewed by me and considered in my medical decision making (see chart for details).  Clinical Course as of Apr 19 901  Mon Apr 13, 2019  1826 Normal  Lipase, blood [EW]  1826 Normal  Ethanol [EW]  1826 Except hemoglobin slightly low  CBC with Differential(!) [EW]  1826 Normal except elevated glucose, BUN, creatinine.  Calcium slightly low.  GFR  low.   [EW]    Clinical Course User Index [EW] Daleen Bo, MD       MDM  Screen complete  Kaileb Monsanto was evaluated in Emergency Department on 04/13/2019 for the symptoms described in the history of present illness. He was evaluated in the context of the global COVID-19 pandemic, which necessitated consideration that the patient might be at risk for infection with the SARS-CoV-2 virus that causes COVID-19. Institutional protocols and algorithms that pertain to the evaluation of patients at risk for COVID-19 are in a state of rapid change based on information released by regulatory bodies including the CDC and federal and state organizations. These policies and algorithms were followed during the patient's care in the ED.   Patient is presenting for evaluation of weakness in the setting of recent heat exposure.  Presentation is perhaps consistent with mild heat exhaustion.  IVF administered. Screening labs obtained. CT head obtained given patients reported headache.   Dr. Eulis Foster aware of pending labs/studies/dispo.   Final Clinical Impressions(s) / ED Diagnoses   Final diagnoses:  Vomiting, intractability of vomiting not specified, presence of nausea not specified, unspecified vomiting type  Heat exposure, initial encounter    ED Discharge  Orders    None       Valarie Merino, MD 04/13/19 1631    Valarie Merino, MD 04/20/19 [phone]

## 2019-04-13 NOTE — Discharge Instructions (Addendum)
The testing today did not show any serious problems.  You may have been overheated to cause you to feel weak and "out of it."  There is no sign of stroke, blood infections, kidney or lung problems.  We sent a TSH test to see if you had a thyroid disease.  When you see your doctor next week for checkup, ask them to talk to you about the result.  Return here, if needed, for problems.

## 2019-04-17 ENCOUNTER — Ambulatory Visit: Payer: BC Managed Care – PPO

## 2019-04-17 ENCOUNTER — Other Ambulatory Visit: Payer: Self-pay

## 2019-04-17 ENCOUNTER — Ambulatory Visit (INDEPENDENT_AMBULATORY_CARE_PROVIDER_SITE_OTHER): Payer: BC Managed Care – PPO | Admitting: Family Medicine

## 2019-04-17 ENCOUNTER — Encounter: Payer: Self-pay | Admitting: Family Medicine

## 2019-04-17 VITALS — BP 150/100 | HR 82 | Temp 98.7°F | Ht 62.0 in | Wt 171.2 lb

## 2019-04-17 DIAGNOSIS — R112 Nausea with vomiting, unspecified: Secondary | ICD-10-CM | POA: Insufficient documentation

## 2019-04-17 DIAGNOSIS — R110 Nausea: Secondary | ICD-10-CM

## 2019-04-17 DIAGNOSIS — R7989 Other specified abnormal findings of blood chemistry: Secondary | ICD-10-CM

## 2019-04-17 MED ORDER — ONDANSETRON 4 MG PO TBDP: 4.0000 mg | ORAL_TABLET | Freq: Three times a day (TID) | ORAL | 0 refills | Status: AC | PRN

## 2019-04-17 NOTE — Progress Notes (Signed)
Subjective:   Patient ID: Jason Pitts    DOB: November 25, 1960, 57 y.o. male   MRN: [id number]  Jason Pitts is a 58 y.o. male with a history of nonischemic cardiomyopathy, HTN, dilated aortic root, prediabetes, tobacco use, memory loss, adjustment insomnia here for   HPI - Seen in ED 8/24 for headache, nausea, nonbloody nonbilious vomiting, and sudden LOC while on a break at work. Is a Architectural technologist at [employer].  Was thought to be due to to heat exhaustion as ED provider noted he was working outside all day although patient denies this.  Per EMS report he was found outside and very sweaty.  He was removed from the heat and given IVF with improvement. CT Head negative.  Labs obtained in the ED unremarkable. - Also reported to EDP recent stress of death of parent 8 mths ago, stress at home with wife who is disabled.  -Reports he did not have any preceding R or symptoms prior to his loss of consciousness but reports he felt tired, drained and "had not felt good" all that day. -Patient reports since Monday, he has had headaches, generalized weakness, nausea and vomiting.  He has not been able to keep much down during this time.  Is able to tolerate some liquids. -BP at home has been ranging from mid 269S to 854O systolic. -Headaches described as bandlike distribution. -Dizziness described as the room is spinning.  Denies hearing loss, ringing in the ears. -Also endorsing some blurry vision but no loss of vision.  Patient has glasses but he is not wearing them today. -Denies sore throat, cough, congestion, fever, chest pain, shortness of breath. -He reports he stopped smoking on Monday. -Denies any medication changes  Review of Systems:  Per HPI.  [place], medications and smoking status reviewed.  Objective:   BP (!) 150/100    Pulse 82    Temp 98.7 F (37.1 C) (Oral)    Ht 5\' 2"  (1.575 m)    Wt 171 lb 4 oz (77.7 kg)    SpO2 97%    BMI 31.32 kg/m  Vitals and nursing note reviewed.  General:  well nourished, well developed, in no acute distress with non-toxic appearance, sitting in wheelchair HEENT: normocephalic, atraumatic, moist mucous membranes Neck: supple, non-tender without lymphadenopathy CV: regular rate and rhythm without murmurs, rubs, or gallops, no lower extremity edema Lungs: clear to auscultation bilaterally with normal work of breathing Abdomen: soft, non-tender, non-distended, normoactive bowel sounds Skin: warm, dry, no rashes or lesions Extremities: warm and well perfused, normal tone MSK: Full ROM, strength 5/5 to U/LE bilaterally. No edema.  Neuro: Alert and oriented, speech normal. PERRL, Extraocular movements intact.  Hearing grossly intact bilaterally.  Tongue protrudes normally with no deviation.  Shoulder shrug, smile symmetric. Finger to nose normal.  Depression screen [facility] 2/9 04/17/2019 01/20/2019 12/04/2017  Decreased Interest 0 0 0  Down, Depressed, Hopeless 2 0 0  PHQ - 2 Score 2 0 0  Altered sleeping 3 - -  Tired, decreased energy 1 - -  Change in appetite 1 - -  Feeling bad or failure about yourself  1 - -  Trouble concentrating 0 - -  Moving slowly or fidgety/restless 0 - -  Suicidal thoughts 0 - -  PHQ-9 Score 8 - -  Difficult doing work/chores Somewhat difficult - -   GAD 7 : Generalized Anxiety Score 04/17/2019  Nervous, Anxious, on Edge 0  Control/stop worrying 0  Worry too much - different things 1  Trouble relaxing 1  Restless 2  Easily annoyed or irritable 2  Afraid - awful might happen 0  Total GAD 7 Score 6  Anxiety Difficulty Somewhat difficult    Assessment & Plan:   Nausea & vomiting Unclear etiology.  Essentially 4-day history of nausea, vomiting, headache and subjective dizziness since episode of loss of consciousness at work on Monday without any apparent preceding etiology.  Was evaluated in ED 8/24 with unremarkable work-up including CT head, EKG, CMP, CBC with differential, TSH.  Exam relatively normal today including  neurologic and cardiac exams.  Visual acuity testing done today and abnormal although patient does not have his glasses today.  Given his report of recent stresses at home and at work, obtain PHQ and gad screening which are 8 and 6, respectively, elevated from prior.  Not likely life-threatening or emergent cause of symptoms given stroke, heart attack, arrhythmia, electrolyte abnormality, seizure essentially ruled out during ED visit and symptoms not consistent with either of the above.  We will however obtain repeat BMP to follow-up elevated creatinine and electrolytes.  Does have history of malignant hypertension with uncontrolled blood pressure and has not been able to take antihypertensives this week due to nausea and vomiting, but not likely sole cause of symptoms.  Symptoms may possibly be related to somatic stress reaction given recent stresses at home and at work, though will not initiate medication for this at this time given acute symptoms. Provided Zofran for nausea relief.  Advised patient to make appointment with cardiologist next week to follow-up loss of consciousness episode.  Also advised to follow-up with PCP next week to evaluate progression of symptoms as well as recent stresses.  Red flags reviewed and reasons to seek emergent care, see AVS for details.  Orders Placed This Encounter  Procedures   Basic Metabolic Panel   Meds ordered this encounter  Medications   ondansetron (ZOFRAN-ODT) 4 MG disintegrating tablet    Sig: Take 1 tablet (4 mg total) by mouth every 8 (eight) hours as needed for nausea or vomiting.    Dispense:  20 tablet    Refill:  0    Ellwood Dense, DO PGY-3, [facility] 04/17/2019 4:58 PM

## 2019-04-17 NOTE — Patient Instructions (Signed)
It was great to see you!  Our plans for today:  - Try taking tylenol and ibuprofen together for your headache. It may be helpful to try a heating pad on the back of your neck. - Call your cardiologist and get in to see them in the next week or so. - We are checking some labs today, we will call you or send you a letter if they are abnormal.  - Come back to see your regular doctor in one week. - If you start to have chest pain, difficulties breathing, sudden vision loss, or are unable to keep any fluids down, go to the ED to be seen.  Take care and seek immediate care sooner if you develop any concerns.   Dr. [facility]

## 2019-04-17 NOTE — Assessment & Plan Note (Addendum)
Unclear etiology.  Essentially 4-day history of nausea, vomiting, headache and subjective dizziness since episode of loss of consciousness at work on Monday without any apparent preceding etiology.  Was evaluated in ED 8/24 with unremarkable work-up including CT head, EKG, CMP, CBC with differential, TSH.  Exam relatively normal today including neurologic and cardiac exams.  Visual acuity testing done today and abnormal although patient does not have his glasses today.  Given his report of recent stresses at home and at work, obtain PHQ and gad screening which are 8 and 6, respectively, elevated from prior.  Not likely life-threatening or emergent cause of symptoms given stroke, heart attack, arrhythmia, electrolyte abnormality, seizure essentially ruled out during ED visit and symptoms not consistent with either of the above.  We will however obtain repeat BMP to follow-up elevated creatinine and electrolytes.  Does have history of malignant hypertension with uncontrolled blood pressure and has not been able to take antihypertensives this week due to nausea and vomiting, but not likely sole cause of symptoms.  Symptoms may possibly be related to somatic stress reaction given recent stresses at home and at work, though will not initiate medication for this at this time given acute symptoms. Provided Zofran for nausea relief.  Advised patient to make appointment with cardiologist next week to follow-up loss of consciousness episode.  Also advised to follow-up with PCP next week to evaluate progression of symptoms as well as recent stresses.  Red flags reviewed and reasons to seek emergent care, see AVS for details.

## 2019-04-18 LAB — BASIC METABOLIC PANEL
BUN/Creatinine Ratio: 15 (ref 9–20)
BUN: 16 mg/dL (ref 6–24)
CO2: 22 mmol/L (ref 20–29)
Calcium: 9.9 mg/dL (ref 8.7–10.2)
Chloride: 105 mmol/L (ref 96–106)
Creatinine, Ser: 1.07 mg/dL (ref 0.76–1.27)
GFR calc Af Amer: 87 mL/min/{1.73_m2} (ref >59)
GFR calc non Af Amer: 76 mL/min/{1.73_m2} (ref >59)
Glucose: 103 mg/dL — ABNORMAL HIGH (ref 65–99)
Potassium: 3.7 mmol/L (ref 3.5–5.2)
Sodium: 142 mmol/L (ref 134–144)

## 2019-04-20 NOTE — Progress Notes (Signed)
Attempted to call pt. No answer. No VM set up. Will try again later. Salvatore Marvel, CMA

## 2019-04-28 ENCOUNTER — Ambulatory Visit (INDEPENDENT_AMBULATORY_CARE_PROVIDER_SITE_OTHER): Payer: BC Managed Care – PPO | Admitting: Family Medicine

## 2019-04-28 ENCOUNTER — Other Ambulatory Visit: Payer: Self-pay

## 2019-04-28 ENCOUNTER — Encounter: Payer: Self-pay | Admitting: Family Medicine

## 2019-04-28 VITALS — BP 162/108 | HR 84

## 2019-04-28 DIAGNOSIS — I10 Essential (primary) hypertension: Secondary | ICD-10-CM | POA: Diagnosis not present

## 2019-04-28 DIAGNOSIS — Z125 Encounter for screening for malignant neoplasm of prostate: Secondary | ICD-10-CM

## 2019-04-28 DIAGNOSIS — I428 Other cardiomyopathies: Secondary | ICD-10-CM

## 2019-04-28 DIAGNOSIS — Z720 Tobacco use: Secondary | ICD-10-CM

## 2019-04-28 DIAGNOSIS — R112 Nausea with vomiting, unspecified: Secondary | ICD-10-CM | POA: Diagnosis not present

## 2019-04-28 MED ORDER — AMLODIPINE BESYLATE 5 MG PO TABS: 5.0000 mg | ORAL_TABLET | Freq: Every day | ORAL | 3 refills | Status: DC

## 2019-04-28 NOTE — Progress Notes (Signed)
Subjective: Chief Complaint  Patient presents with  . Follow-up     HPI: Jason Pitts is a 58 y.o. presenting to clinic today to discuss the following:  1 F/U Nausea, stress Seen in ED on 8/24 for headache, nausea, vomiting, sudden loss of consciousness at work.  Reports that he has been feeling better about a week and half.  Drank a [employer] when he was seen in the ED and thinks that this was the cause of his symptoms. He reports that he has not had any further episodes of loss of consciousness.  Reports that he has been in his normal state of health, and notes "I am really looking to take my health seriously."  Patient also reports that he is scheduled for a follow-up visit with his cardiologist.  Reports that he is due for an echo and had missed his previous appointments, so he needs another order for this and assistance with scheduling.  Works at [employer].  Has been having extra stress with COVID-19.  Has been having extra stress since June.  No SI/HI.  Feels like he is handling the stress well and does not want medication at this time.  2 HTN  Notes that he has taken his medications today.  Usually 140s/90s when he checks it at home.  Take hydralazine 100mg  TID, Entresto, Coreg 3.125 mg BID.  No CP, SOB, dizziness since the episode of nausea.  No headaches, no changes in vision.   No focal weakness.  Has been walking in the mornings.    3 Cardiomyopathy, HFrEF Has a history of cardiomyopathy, follows with cardiology.  Reports that he is due for an echo, but has missed this appointment multiple times, therefore was taken off the list.  Is requesting another order for echo and assistance with scheduling so that he is able to go to this.  Takes Coreg and [place].  Last echo 02/2017, EF 30 to 35%, G1DD.  Denies chest pain, shortness of breath, lower extremity edema.  4 Tobacco Abuse Patient reports that he stopped smoking after episode on 8/26.  5 Prostate Cancer Screening Inquiring  about prostate cancer screening and what would be available.  Denies difficulty urinating.  Denies history of prostate cancer.  Health Maintenance: Needs flu shot, advised of this     ROS noted in HPI.   Past Medical, Surgical, Social, and Family History Reviewed & Updated per EMR.   Pertinent Historical Findings include: Cardiomypoathy   Social History   Tobacco Use  Smoking Status Current Every Day Smoker  . Packs/day: 0.30  . Years: 40.00  . Pack years: 12.00  Smokeless Tobacco Never Used      Objective: BP (!) 162/108   Pulse 84   SpO2 96%  Vitals and nursing notes reviewed  Physical Exam:  General: 58 y.o. male in NAD HEENT: NCAT, PERRL Cardio: RRR no m/r/g Lungs: CTAB, no wheezing, no rhonchi, no crackles, no IWOB on RA Abdomen: Soft, non-tender to palpation, non-distended, positive bowel sounds Skin: warm and dry Extremities: No edema Neuro: grossly neurologically intact, gait normal   No results found for this or any previous visit (from the past 72 hour(s)).  Assessment/Plan:  Nausea & vomiting Resolved.  Work up at ED with EKG, CMP, CBC, TSH, CT head WNL.  Continue to monitor and follow up with cardiology as scheduled.  Echo ordered and scheduled for patient.  Declines treatment for stress at this time, will continue to monitor as could also be  contributing.  Nonischemic cardiomyopathy (HCC) Stable.  Advised to follow up with cardiology.  On Coreg and Entresto.  Echo ordered and scheduled for patient.  Malignant hypertension Not well controlled. Will add norvasc to regimen at 5mg  and follow up in 1 month for HTN follow up and yearly exam, including Lipid panel. - norvasc 5mg  QD - cont entresto - cont hydral - f/u 1 month  Tobacco abuse Congratulated on quitting.  Advised of quit line resources if needed and given on AVS.  Will continue to follow.  Prostate cancer screening information given during patient encounter Discussed that PSA not  recommended outside of monitoring for known prostate cancer.  Offered DRE at next visit and he agreed.  Will also obtain FOBT at that time as has been since 07/2017 since cologuard.       PATIENT EDUCATION PROVIDED: See AVS    Diagnosis and plan along with any newly prescribed medication(s) were discussed in detail with this patient today. The patient verbalized understanding and agreed with the plan. Patient advised if symptoms worsen return to clinic or ER.   Health Maintainance: will perform flu shot and lipid panel at next visit, patient also requesting prostate screening at next visit, FOBT at next visit and would repeat yearly as he declines colonscopy   Orders Placed This Encounter  Procedures  . ECHOCARDIOGRAM COMPLETE    Standing Status:   Future    Standing Expiration Date:   07/27/2020    Order Specific Question:   Where should this test be performed    Answer:   [place]    Order Specific Question:   Perflutren DEFINITY (image enhancing agent) should be administered unless hypersensitivity or allergy exist    Answer:   Administer Perflutren    Order Specific Question:   Reason for exam-Echo    Answer:   Cardiomyopathy-Unspecified  425.9 / I42.9    Meds ordered this encounter  Medications  . amLODipine (NORVASC) 5 MG tablet    Sig: Take 1 tablet (5 mg total) by mouth at bedtime.    Dispense:  90 tablet    Refill:  [place], DO 04/30/2019, 11:54 AM PGY-2 [place]

## 2019-04-28 NOTE — Patient Instructions (Addendum)
Thank you for coming to see me today. It was a pleasure. Today we talked about:   We will start a new medication called norvasc.  Take it every night.  Please follow-up with me in 1 month.  Call 1-800-QUIT-NOW for help with stopping smoking. They can assist with free resources such as patches, check-in calls, and counseling.   If you have any questions or concerns, please do not hesitate to call the office at [phone].  Best,   [place] Constable, DO

## 2019-04-30 ENCOUNTER — Ambulatory Visit (HOSPITAL_COMMUNITY): Payer: BC Managed Care – PPO

## 2019-04-30 ENCOUNTER — Encounter: Payer: Self-pay | Admitting: Family Medicine

## 2019-04-30 DIAGNOSIS — Z125 Encounter for screening for malignant neoplasm of prostate: Secondary | ICD-10-CM | POA: Insufficient documentation

## 2019-04-30 NOTE — Assessment & Plan Note (Signed)
Not well controlled. Will add norvasc to regimen at 5mg  and follow up in 1 month for HTN follow up and yearly exam, including Lipid panel. - norvasc 5mg  QD - cont entresto - cont hydral - f/u 1 month

## 2019-04-30 NOTE — Assessment & Plan Note (Addendum)
Resolved.  Work up at ED with EKG, CMP, CBC, TSH, CT head WNL.  Continue to monitor and follow up with cardiology as scheduled.  Echo ordered and scheduled for patient.  Declines treatment for stress at this time, will continue to monitor as could also be contributing.

## 2019-04-30 NOTE — Assessment & Plan Note (Signed)
Discussed that PSA not recommended outside of monitoring for known prostate cancer.  Offered DRE at next visit and he agreed.  Will also obtain FOBT at that time as has been since 07/2017 since cologuard.

## 2019-04-30 NOTE — Assessment & Plan Note (Signed)
Congratulated on quitting.  Advised of quit line resources if needed and given on AVS.  Will continue to follow.

## 2019-04-30 NOTE — Assessment & Plan Note (Signed)
Stable.  Advised to follow up with cardiology.  On Coreg and Entresto.  Echo ordered and scheduled for patient.

## 2019-05-08 ENCOUNTER — Ambulatory Visit (HOSPITAL_COMMUNITY): Payer: BC Managed Care – PPO | Attending: Family Medicine

## 2019-06-02 ENCOUNTER — Ambulatory Visit: Payer: BC Managed Care – PPO | Admitting: Family Medicine

## 2019-06-02 NOTE — Progress Notes (Deleted)
     Subjective: No chief complaint on file.    HPI: Jason Pitts is a 58 y.o. presenting to clinic today to discuss the following:  1 Hypertension: - Medications: Entresto BID, Coreg BID, Norvasc 5mg .  Placed on Norvasc on 9/8. - Compliance: *** - Checking BP at home: *** - Denies any SOB, CP, vision changes, LE edema, medication SEs, or symptoms of hypotension - Diet: *** - Exercise: ***   2 Encounter for Prostate Cancer Screening Discussed PSA and DRE at last exam ***   3 Lipid Screening Last Lipid Panel 02/2017 and was WNL.  Would like to have this checked today.  Had CMP performed in August 2020 in ED and repeated on 8/28.  Were WNL.  Health Maintenance: ***     ROS noted in HPI. Chief complaint noted.  Other Pertinent PMH: *** Past Medical, Surgical, Social, and Family History Reviewed & Updated per EMR.      Social History   Tobacco Use  Smoking Status Current Every Day Smoker  . Packs/day: 0.30  . Years: 40.00  . Pack years: 12.00  Smokeless Tobacco Never Used   Smoking status noted.    Objective: There were no vitals taken for this visit. Vitals and nursing notes reviewed  Physical Exam: *** General: 58 y.o. male in NAD Cardio: RRR no m/r/g Lungs: CTAB, no wheezing, no rhonchi, no crackles, no IWOB on *** Abdomen: Soft, non-tender to palpation, non-distended, positive bowel sounds Skin: warm and dry Extremities: No edema   No results found for this or any previous visit (from the past 72 hour(s)).  Assessment/Plan:  No problem-specific Assessment & Plan notes found for this encounter.     PATIENT EDUCATION PROVIDED: See AVS    Diagnosis and plan along with any newly prescribed medication(s) were discussed in detail with this patient today. The patient verbalized understanding and agreed with the plan. Patient advised if symptoms worsen return to clinic or ER.   Health Maintainance:   No orders of the defined types were placed in  this encounter.   No orders of the defined types were placed in this encounter.    [place] Constable, DO 06/02/2019, 1:26 PM PGY-2 [name]

## 2019-06-26 ENCOUNTER — Ambulatory Visit: Payer: BC Managed Care – PPO | Admitting: Internal Medicine

## 2019-07-01 ENCOUNTER — Other Ambulatory Visit: Payer: Self-pay | Admitting: Internal Medicine

## 2019-07-06 ENCOUNTER — Other Ambulatory Visit: Payer: Self-pay

## 2019-07-07 MED ORDER — HYDRALAZINE HCL 50 MG PO TABS: 100.0000 mg | ORAL_TABLET | Freq: Three times a day (TID) | ORAL | 0 refills | Status: DC

## 2019-07-07 NOTE — Progress Notes (Deleted)
     Subjective: No chief complaint on file.    HPI: Jason Pitts is a 58 y.o. presenting to clinic today to discuss the following:  1 Hypertension: - Medications: hydralazine 100mg  TID, Entresto, Coreg 3.125 mg BID, norvasc 5mg  (added at last visit) - Compliance: *** - Checking BP at home: *** - Denies any SOB, CP, vision changes, LE edema, medication SEs, or symptoms of hypotension - Diet: *** - Exercise: ***  2 Prostate Cancer Screening Patient notes that he would like a rectal exam after discussion at last visit.  Has also not had FOBT since 07/2017, therefore will have that today as well.  3 Nonischemic Cardiomyopathy Echo ordered at last visit, patient has not completed this and has not follow up with cardiology ***.  4 Tobacco Abuse ***  Health Maintenance: ***     ROS noted in HPI. Chief complaint noted.  Other Pertinent PMH: *** Past Medical, Surgical, Social, and Family History Reviewed & Updated per EMR.      Social History   Tobacco Use  Smoking Status Current Every Day Smoker  . Packs/day: 0.30  . Years: 40.00  . Pack years: 12.00  Smokeless Tobacco Never Used   Smoking status noted.    Objective: There were no vitals taken for this visit. Vitals and nursing notes reviewed  Physical Exam: *** General: 58 y.o. male in NAD Cardio: RRR no m/r/g Lungs: CTAB, no wheezing, no rhonchi, no crackles, no IWOB on *** Abdomen: Soft, non-tender to palpation, non-distended, positive bowel sounds Skin: warm and dry Extremities: No edema   No results found for this or any previous visit (from the past 72 hour(s)).  Assessment/Plan:  No problem-specific Assessment & Plan notes found for this encounter.     PATIENT EDUCATION PROVIDED: See AVS    Diagnosis and plan along with any newly prescribed medication(s) were discussed in detail with this patient today. The patient verbalized understanding and agreed with the plan. Patient advised if symptoms  worsen return to clinic or ER.   Health Maintainance:   No orders of the defined types were placed in this encounter.   No orders of the defined types were placed in this encounter.    [place] Constable, DO 07/07/2019, 9:46 AM PGY-2 [place]

## 2019-07-08 ENCOUNTER — Ambulatory Visit: Payer: BC Managed Care – PPO | Admitting: Family Medicine

## 2019-07-20 ENCOUNTER — Ambulatory Visit (INDEPENDENT_AMBULATORY_CARE_PROVIDER_SITE_OTHER): Payer: BC Managed Care – PPO | Admitting: Internal Medicine

## 2019-07-20 ENCOUNTER — Other Ambulatory Visit: Payer: Self-pay

## 2019-07-20 ENCOUNTER — Encounter: Payer: Self-pay | Admitting: Internal Medicine

## 2019-07-20 VITALS — BP 175/117 | HR 78 | Temp 97.0°F | Ht 63.0 in | Wt 180.0 lb

## 2019-07-20 DIAGNOSIS — I5022 Chronic systolic (congestive) heart failure: Secondary | ICD-10-CM

## 2019-07-20 DIAGNOSIS — I428 Other cardiomyopathies: Secondary | ICD-10-CM | POA: Diagnosis not present

## 2019-07-20 DIAGNOSIS — I10 Essential (primary) hypertension: Secondary | ICD-10-CM | POA: Diagnosis not present

## 2019-07-20 DIAGNOSIS — I77810 Thoracic aortic ectasia: Secondary | ICD-10-CM | POA: Diagnosis not present

## 2019-07-20 MED ORDER — CARVEDILOL 6.25 MG PO TABS: 6.2500 mg | ORAL_TABLET | Freq: Two times a day (BID) | ORAL | 3 refills | Status: DC

## 2019-07-20 NOTE — Progress Notes (Signed)
OFFICE FOLLOW-UP NOTE  Chief Complaint:  Follow-up heart failure  Primary Care Physician: Unknown Jim, DO  HPI:  Ameen Chico is a 58 y.o. male with a past medial history significant for chest pain, tobacco abuse, malignant hypertension, family history of premature coronary artery disease, and recent admission for chest pain. He ruled out for MI and underwent a nuclear stress test which was negative for ischemia however showed severely dilated LV with global hypokinesis and EF of 26%. Echo on 03/05/2017 showed an EF of 30-35% with global hypokinesis and a dilated aortic root of 4.3 cm. He was placed on appropriate medications including BiDil, but reported that was too expensive for him and did not give the medicine filled. Today he returns for follow-up. Blood pressure initially was elevated at 180/124. He denies headache, blurred vision, decreased urine output or chest pain.  06/21/2017  Mr. Blaskowski returns today for follow-up of his heart failure.  Overall he seems to be doing well.  He denies any worsening shortness of breath or chest pain.  Unfortunately discontinued carvedilol for unknown reasons.  He did not do his follow-up blood work because he had a death in the family but plans to get that blood work drawn when he sees Dr. Chanetta Marshall in an upcoming visit.  He is heart rate was noted to be low in the 50s recently and they deferred restarting his beta-blocker however it is elevated again today and he would benefit from a cardiac standpoint from that.  In addition he would benefit from up titration of his Entresto.  Blood pressure today is 162/90 and at his lowest he said was about 127 systolic.  05/15/2018  Mr. Bertino is seen today for follow-up heart failure.  Overall he endorses NYHA class I to maximally class II symptoms.  He is able to continue to work without any issues.  He denies chest pain or worsening shortness of breath.  His blood pressure has remained elevated and was  145/93 today.  He is on the moderate dose of Entresto.  He was recommended to have a repeat echo however that has not yet been performed.  07/20/2019  Mr. Athey is seen today in follow-up.  He continues to have poorly controlled hypertension.  When I last saw him via virtual visit his blood pressure was normal however nearly every other visit in the office shows poorly controlled hypertension.  He does have a history of cardiomyopathy with EF 30 to 35% but that has not been reassessed since 2018.  He denies any significant heart failure symptoms but does get fatigue and shortness of breath with marked exertion, suggestive of NYHA class II symptoms.  Today blood pressure significantly elevated again at 175/117.  He recently had some medications added by his family medicine resident physicians, including either amlodipine or changes in the hydralazine.  He is not sure that the medicine makes him feel that well.  PMHx:  Past Medical History:  Diagnosis Date  . Acute on chronic combined systolic and diastolic CHF (congestive heart failure) (HCC) 06/21/2017  . Chest pain 03/04/2017  . Dysuria 01/06/2018  . Family history of early CAD   . HTN (hypertension)   . Irregular cardiac rhythm 05/15/2017  . 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 Tobacco use     Past Surgical History:  Procedure Laterality Date  . MANDIBLE FRACTURE SURGERY      FAMHx:  Family History  Problem Relation Age of Onset  . Heart failure Father   .  CAD Father        24's    SOCHx:   reports that he has been smoking. He has a 12.00 pack-year smoking history. He has never used smokeless tobacco. He reports current alcohol use. He reports that he does not use drugs.  ALLERGIES:  No Known Allergies  ROS: Pertinent items noted in HPI and remainder of comprehensive ROS otherwise negative.  HOME MEDS: Current Outpatient Medications on File Prior to Visit  Medication Sig Dispense Refill  . amLODipine (NORVASC) 5 MG tablet  Take 1 tablet (5 mg total) by mouth at bedtime. 90 tablet 3  . aspirin EC 81 MG tablet Take 1 tablet (81 mg total) by mouth daily. 90 tablet 3  . diphenhydrAMINE (BENADRYL) 25 MG tablet Take 1 tablet (25 mg total) by mouth every 8 (eight) hours as needed. 30 tablet 0  . ENTRESTO 97-103 MG Take 1 tablet by mouth twice daily 60 tablet 0  . famotidine (PEPCID) 20 MG tablet Take 1 tablet (20 mg total) by mouth 2 (two) times daily. 30 tablet 0  . hydrALAZINE (APRESOLINE) 50 MG tablet Take 2 tablets (100 mg total) by mouth 3 (three) times daily. 90 tablet 0  . Multiple Vitamin (MULTIVITAMIN) tablet Take 1 tablet by mouth daily.    . ondansetron (ZOFRAN-ODT) 4 MG disintegrating tablet Take 1 tablet (4 mg total) by mouth every 8 (eight) hours as needed for nausea or vomiting. 20 tablet 0  . traZODone (DESYREL) 50 MG tablet Take 0.5-1 tablets (25-50 mg total) by mouth at bedtime as needed for sleep. 30 tablet 3   No current facility-administered medications on file prior to visit.     LABS/IMAGING: No results found for this or any previous visit (from the past 48 hour(s)). No results found.  LIPID PANEL:    Component Value Date/Time   CHOL 160 03/04/2017 0853   TRIG 85 03/04/2017 0853   HDL 44 03/04/2017 0853   CHOLHDL 3.6 03/04/2017 0853   VLDL 17 03/04/2017 0853   LDLCALC 99 03/04/2017 0853     WEIGHTS: Wt Readings from Last 3 Encounters:  07/20/19 180 lb (81.6 kg)  04/17/19 171 lb 4 oz (77.7 kg)  01/19/19 181 lb (82.1 kg)    VITALS: BP (!) 175/117   Pulse 78   Temp (!) 97 F (36.1 C)   Ht 5\' 3"  (1.6 m)   Wt 180 lb (81.6 kg)   SpO2 97%   BMI 31.89 kg/m   EXAM: General appearance: alert and no distress Neck: no carotid bruit, no JVD and thyroid not enlarged, symmetric, no tenderness/mass/nodules Lungs: clear to auscultation bilaterally Heart: regular rate and rhythm, S1, S2 normal, no murmur, click, rub or gallop Abdomen: soft, non-tender; bowel sounds normal; no masses,   no organomegaly Extremities: extremities normal, atraumatic, no cyanosis or edema Pulses: 2+ and symmetric Skin: Skin color, texture, turgor normal. No rashes or lesions Neurologic: Grossly normal Psych: Pleasant  EKG: Sinus rhythm with PVCs at 78, voltage criteria for LVH, ST and T wave changes inferiorly-personally reviewed  ASSESSMENT: 1. Uncontrolled hypertension 2. Acute systolic congestive heart failure -of EF 30-35%, dilated ventricle 3. 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 Intermediate, but acceptable risk for colonoscopy  PLAN: 1.  Mr. Malczewski continues to have poorly controlled hypertension.  We will increase his carvedilol further to 6.25 mg twice daily.  He reports medication compliance.  He has not had reassessment of his LV function since echo in 2018.  We will order that  today.  Unfortunately tells me that he is going to lose his benefits after his work hours are decreased by [employer] at the end of this year.  This will make further medical therapy complicated.  I highly encouraged him to look for other insurance options, possibly through the affordable care act or to look for alternative employment that would provide insurance.  Follow-up in 3 months.  Pixie Casino, MD, [facility], [place] Director of the Advanced Lipid Disorders &  Cardiovascular Risk Reduction Clinic Diplomate of the American Board of Clinical Lipidology Attending Cardiologist  Direct Dial: [phone]  Fax: [phone]  Website:  [email or web address] 07/20/2019, 3:33 PM

## 2019-07-20 NOTE — Patient Instructions (Signed)
Medication Instructions:  INCREASE carvedilol to 6.25mg  twice daily Continue current medications  *If you need a refill on your cardiac medications before your next appointment, please call your pharmacy*  Lab Work: NONE If you have labs (blood work) drawn today and your tests are completely normal, you will receive your results only by: Marland Kitchen MyChart Message (if you have MyChart) OR . A paper copy in the mail If you have any lab test that is abnormal or we need to change your treatment, we will call you to review the results.  Testing/Procedures: ECHO to be scheduled at [address]  Follow-Up: At [facility], you and your health needs are our priority.  As part of our continuing mission to provide you with exceptional heart care, we have created designated Provider Care Teams.  These Care Teams include your primary Cardiologist (physician) and Advanced Practice Providers (APPs -  Physician Assistants and Nurse Practitioners) who all work together to provide you with the care you need, when you need it.  Your next appointment:   3 month(s)  The format for your next appointment:   In Person  Provider:   K. Mali Hilty, MD  Other Instructions

## 2019-07-30 ENCOUNTER — Other Ambulatory Visit (HOSPITAL_COMMUNITY): Payer: BC Managed Care – PPO

## 2019-07-31 ENCOUNTER — Other Ambulatory Visit (HOSPITAL_COMMUNITY): Payer: BC Managed Care – PPO

## 2019-08-03 ENCOUNTER — Ambulatory Visit (INDEPENDENT_AMBULATORY_CARE_PROVIDER_SITE_OTHER): Payer: BC Managed Care – PPO | Admitting: Family Medicine

## 2019-08-03 ENCOUNTER — Other Ambulatory Visit: Payer: Self-pay

## 2019-08-03 ENCOUNTER — Encounter: Payer: Self-pay | Admitting: Family Medicine

## 2019-08-03 VITALS — BP 134/76 | HR 74 | Wt 183.0 lb

## 2019-08-03 DIAGNOSIS — R37 Sexual dysfunction, unspecified: Secondary | ICD-10-CM | POA: Diagnosis not present

## 2019-08-03 DIAGNOSIS — Z125 Encounter for screening for malignant neoplasm of prostate: Secondary | ICD-10-CM

## 2019-08-03 DIAGNOSIS — I428 Other cardiomyopathies: Secondary | ICD-10-CM

## 2019-08-03 DIAGNOSIS — R21 Rash and other nonspecific skin eruption: Secondary | ICD-10-CM

## 2019-08-03 DIAGNOSIS — Z87898 Personal history of other specified conditions: Secondary | ICD-10-CM | POA: Diagnosis not present

## 2019-08-03 DIAGNOSIS — I10 Essential (primary) hypertension: Secondary | ICD-10-CM

## 2019-08-03 NOTE — Progress Notes (Signed)
Subjective: Chief Complaint  Patient presents with  . Follow-up    prostate   . Hypertension     HPI: Jason Pitts is a 58 y.o. presenting to clinic today to discuss the following:  1 HTN Hx cardiomyopathy, follows with cardiology.  Last seen on 11/30.  At that time, Coreg was increased to 6.25mg  BID.  BP has been better controlled.  Also notes that he has been changing his diet more.  Planning for Echo, thought that it was this coming Friday, but it was last Friday and he missed it.  Denies any current chest pain or difficulty breathing.  No longer taking amlodipine as it caused him headaches.  2 Prostate Father passed from CHF, but had prostate cancer.  Sexual performance is bothering him.  No weight loss, night sweats.  Reports peeing all the time lately and gets up multiple times at night to urinate.  3 Rash on butt bone Few weeks.  Was sore,  Changed soap recently.  Itching.  Has been using cortisone and alcohol, seems to be helping.  No fevers.  4 Prediabetes A1c in 2008 6.1.  Has not had A1c since that time.  Health Maintenance: Lipid panel today    Office Visit from 08/03/2019 in [place]  PHQ-2 Total Score  0         ROS noted in HPI. Chief complaint noted.  Other Pertinent PMH: Nonischemic cardiomyopathy, hypertension, tobacco abuse Past Medical, Surgical, Social, and Family History Reviewed & Updated per EMR.      Social History   Tobacco Use  Smoking Status Current Every Day Smoker  . Packs/day: 0.30  . Years: 40.00  . Pack years: 12.00  Smokeless Tobacco Never Used   Smoking status noted.    Objective: BP 134/76   Pulse 74   Wt 183 lb (83 kg)   SpO2 97%   BMI 32.42 kg/m  Vitals and nursing notes reviewed  Physical Exam:  General: 58 y.o. male in NAD Lungs: Breathing comfortably on room air Skin: warm and dry, other region of gluteal cleft exposed by patient, wife in the room at the time, no rash noted, no  erythema Extremities: No edema, moves all 4 extremities equally, ambulates without difficulty   No results found for this or any previous visit (from the past 72 hour(s)).  Assessment/Plan:  Essential hypertension Blood pressure now much better controlled with addition of Coreg.  We will continue management per cardiology.  Continue on hydralazine 100 mg 3 times daily, Entresto, Coreg 6.25 twice daily.  Norvasc discontinued from med list as patient is not taking.  Nonischemic cardiomyopathy ([place]) Echo reordered today and appointment made for 12/23 for patient given that he would like this done before the end of the year due to his deductible.  History of prediabetes Last A1c was 6.1 in 2018.  Will order when patient comes for lab visit.  We will also obtain lipid panel as none since 2018.  Sexual dysfunction Multifactorial, patient does have a history of cardiac disease, therefore this could be contributing.  Will obtain lipid panel today.  Patient advised that he will talk about this at another appointment further.  Screening PSA (prostate specific antigen) Discussed with patient recommendations regarding PSA given his symptoms and family history of prostate cancer.  Discussed risks and benefits.  Patient would like to opt for PSA screening.  Future order placed, patient to come back for this.  Would consider urology referral  pending results.  Rash and nonspecific skin eruption No rash noted on exam, patient notes that it must have improved.  Did advise that given the location, could have been a pilonidal cyst.  Would not make any changes with management at this time.  He was advised to come back if reoccurs.     PATIENT EDUCATION PROVIDED: See AVS    Diagnosis and plan along with any newly prescribed medication(s) were discussed in detail with this patient today. The patient verbalized understanding and agreed with the plan. Patient advised if symptoms worsen return to clinic or ER.      Orders Placed This Encounter  Procedures  . PSA    Standing Status:   Future    Standing Expiration Date:   08/02/2020  . Lipid Panel    Standing Status:   Future    Standing Expiration Date:   08/02/2020  . Hemoglobin A1c    Standing Status:   Future    Standing Expiration Date:   08/02/2020  . ECHOCARDIOGRAM COMPLETE    Standing Status:   Future    Standing Expiration Date:   10/31/2020    Order Specific Question:   Where should this test be performed    Answer:   [facility])    Order Specific Question:   Does the patient weigh less than or greater than 250 lbs?    Answer:   Patient weighs less than 250 lbs    Order Specific Question:   Perflutren DEFINITY (image enhancing agent) should be administered unless hypersensitivity or allergy exist    Answer:   Administer Perflutren    Order Specific Question:   Reason for exam-Echo    Answer:   Congestive Heart Failure  428.0 / I50.9    No orders of the defined types were placed in this encounter.    Luis Abed, DO 08/03/2019, 5:28 PM PGY-2 [facility]

## 2019-08-03 NOTE — Patient Instructions (Signed)
Thank you for coming to see me today. It was a pleasure. Today we talked about:   Your echo:  Please get this done.  Your prostate and other concerns:  Come back at your convenience for a blood draw.  You don't need an appointment for this.  Your blood pressure:  It looks great!  Keep up the great work!  Your rash: It looks improved, keep doing what you are doing!  Please follow-up with me in 2-3 months or sooner as needed.  If you have any questions or concerns, please do not hesitate to call the office at [phone].  Best,   [place] Constable, DO

## 2019-08-03 NOTE — Assessment & Plan Note (Signed)
Multifactorial, patient does have a history of cardiac disease, therefore this could be contributing.  Will obtain lipid panel today.  Patient advised that he will talk about this at another appointment further.

## 2019-08-03 NOTE — Assessment & Plan Note (Signed)
Discussed with patient recommendations regarding PSA given his symptoms and family history of prostate cancer.  Discussed risks and benefits.  Patient would like to opt for PSA screening.  Future order placed, patient to come back for this.  Would consider urology referral pending results.

## 2019-08-03 NOTE — Assessment & Plan Note (Signed)
Echo reordered today and appointment made for 12/23 for patient given that he would like this done before the end of the year due to his deductible.

## 2019-08-03 NOTE — Assessment & Plan Note (Signed)
Blood pressure now much better controlled with addition of Coreg.  We will continue management per cardiology.  Continue on hydralazine 100 mg 3 times daily, Entresto, Coreg 6.25 twice daily.  Norvasc discontinued from med list as patient is not taking.

## 2019-08-03 NOTE — Assessment & Plan Note (Addendum)
Last A1c was 6.1 in 2018.  Will order when patient comes for lab visit.  We will also obtain lipid panel as none since 2018.

## 2019-08-03 NOTE — Assessment & Plan Note (Signed)
No rash noted on exam, patient notes that it must have improved.  Did advise that given the location, could have been a pilonidal cyst.  Would not make any changes with management at this time.  He was advised to come back if reoccurs.

## 2019-08-12 ENCOUNTER — Other Ambulatory Visit (HOSPITAL_COMMUNITY): Payer: BC Managed Care – PPO

## 2019-08-13 ENCOUNTER — Encounter (HOSPITAL_COMMUNITY): Payer: Self-pay | Admitting: Radiology

## 2019-08-25 ENCOUNTER — Other Ambulatory Visit (HOSPITAL_COMMUNITY): Payer: BC Managed Care – PPO

## 2019-08-27 ENCOUNTER — Telehealth (HOSPITAL_COMMUNITY): Payer: Self-pay | Admitting: Radiology

## 2019-08-27 NOTE — Telephone Encounter (Signed)
Just an FYI. We have made several attempts to contact this patient including sending a letter to schedule or reschedule their echocardiogram. We will be removing the patient from the echo WQ.  08/18/19 @10 :47 LM with wife to call office Evd  12//24 mailed letter evd  12/23 no show for echo evd  12/11 no show for echo evd  12/10 pt cancelled echo evd     Thank you

## 2019-08-28 ENCOUNTER — Other Ambulatory Visit: Payer: Self-pay

## 2019-08-28 MED ORDER — TRAZODONE HCL 50 MG PO TABS: 25.0000 mg | ORAL_TABLET | Freq: Every evening | ORAL | 3 refills | Status: DC | PRN

## 2019-08-28 MED FILL — traZODone HCL 50 MG TABS: 50 | 30 days supply | Qty: 30 | Fill #0

## 2019-09-07 ENCOUNTER — Other Ambulatory Visit: Payer: Self-pay | Admitting: Family Medicine

## 2019-09-07 ENCOUNTER — Telehealth: Payer: Self-pay | Admitting: *Deleted

## 2019-09-07 ENCOUNTER — Other Ambulatory Visit: Payer: Self-pay | Admitting: Internal Medicine

## 2019-09-07 NOTE — Telephone Encounter (Signed)
LVM on home phone to call office back.  We received a refill request for his Trazodone from [facility] but he has refills at the [facility]. Wanted to be sure that he knew that and that he still wanted to use the [facility]. Emaleigh Guimond Zimmerman Rumple, CMA

## 2019-10-19 ENCOUNTER — Ambulatory Visit: Payer: BC Managed Care – PPO | Admitting: Internal Medicine

## 2019-10-20 ENCOUNTER — Encounter: Payer: Self-pay | Admitting: Family Medicine

## 2019-10-20 NOTE — Progress Notes (Signed)
Patient requesting letter stating medical issues and recommendations on heavy lifting.  Printed and placed at front for pick up.

## 2019-10-21 ENCOUNTER — Ambulatory Visit: Payer: BC Managed Care – PPO | Admitting: Internal Medicine

## 2019-11-10 ENCOUNTER — Encounter: Payer: Self-pay | Admitting: Internal Medicine

## 2019-11-26 ENCOUNTER — Other Ambulatory Visit: Payer: Self-pay | Admitting: Family Medicine

## 2019-11-26 ENCOUNTER — Other Ambulatory Visit (HOSPITAL_COMMUNITY): Payer: Self-pay | Admitting: Family Medicine

## 2019-11-26 DIAGNOSIS — I10 Essential (primary) hypertension: Secondary | ICD-10-CM

## 2019-11-26 MED ORDER — HYDRALAZINE HCL 50 MG PO TABS: 100.0000 mg | ORAL_TABLET | Freq: Three times a day (TID) | ORAL | 3 refills | Status: DC

## 2019-11-26 MED ORDER — CARVEDILOL 6.25 MG PO TABS: 6.2500 mg | ORAL_TABLET | Freq: Two times a day (BID) | ORAL | 3 refills | Status: AC

## 2019-11-26 MED FILL — CARVEDILOL 6.25 MG TABLET: 6.25 | 90 days supply | Qty: 180 | Fill #0

## 2019-11-26 MED FILL — hydrALAZINE HCL 50 MG TABS: 50 | 15 days supply | Qty: 90 | Fill #0

## 2019-11-26 NOTE — Progress Notes (Signed)
Requesting refills sent to [facility]

## 2019-11-27 ENCOUNTER — Telehealth: Payer: Self-pay | Admitting: Family Medicine

## 2019-11-27 NOTE — Telephone Encounter (Signed)
Created in error

## 2019-12-07 MED FILL — hydrALAZINE HCL 50 MG TABS: 50 | 15 days supply | Qty: 90 | Fill #0

## 2019-12-07 MED FILL — CARVEDILOL 6.25 MG TABLET: 6.25 | 30 days supply | Qty: 60 | Fill #0

## 2019-12-08 ENCOUNTER — Encounter: Payer: Self-pay | Admitting: Family Medicine

## 2019-12-08 ENCOUNTER — Telehealth: Payer: Self-pay | Admitting: Pharmacist

## 2019-12-08 ENCOUNTER — Other Ambulatory Visit: Payer: Self-pay

## 2019-12-08 ENCOUNTER — Ambulatory Visit (INDEPENDENT_AMBULATORY_CARE_PROVIDER_SITE_OTHER): Payer: Self-pay | Admitting: Family Medicine

## 2019-12-08 ENCOUNTER — Ambulatory Visit: Payer: Self-pay | Admitting: Licensed Clinical Social Worker

## 2019-12-08 VITALS — BP 188/110 | HR 66 | Ht 63.0 in | Wt 179.4 lb

## 2019-12-08 DIAGNOSIS — G479 Sleep disorder, unspecified: Secondary | ICD-10-CM

## 2019-12-08 DIAGNOSIS — Z720 Tobacco use: Secondary | ICD-10-CM

## 2019-12-08 DIAGNOSIS — I428 Other cardiomyopathies: Secondary | ICD-10-CM

## 2019-12-08 DIAGNOSIS — Z598 Other problems related to housing and economic circumstances: Secondary | ICD-10-CM

## 2019-12-08 DIAGNOSIS — Z5989 Other problems related to housing and economic circumstances: Secondary | ICD-10-CM

## 2019-12-08 DIAGNOSIS — R6882 Decreased libido: Secondary | ICD-10-CM | POA: Insufficient documentation

## 2019-12-08 DIAGNOSIS — Z8679 Personal history of other diseases of the circulatory system: Secondary | ICD-10-CM

## 2019-12-08 MED ORDER — TRAZODONE HCL 50 MG PO TABS: 25.0000 mg | ORAL_TABLET | Freq: Every evening | ORAL | 3 refills | Status: AC | PRN

## 2019-12-08 MED FILL — traZODone HCL 50 MG TABS: 50 | 30 days supply | Qty: 30 | Fill #0

## 2019-12-08 NOTE — Telephone Encounter (Signed)
Could we give this patient a month worth of Entresto 97/103 while he applies for patient assistance?

## 2019-12-08 NOTE — Assessment & Plan Note (Signed)
Patient has a history of nonischemic cardiomyopathy, needs an echo, but cannot afford at present.  Sherryll Burger was able to be secured for patient as this will assist with mortality benefit.  Would like for him to get back to Dr. Rennis Golden soon as possible.  See no insurance plan below.

## 2019-12-08 NOTE — Telephone Encounter (Signed)
I can give him 1 bottle of the 49-51 mg tablets of entresto.

## 2019-12-08 NOTE — Progress Notes (Signed)
SUBJECTIVE:   CHIEF COMPLAINT / HPI:   HTN Hx cardiomyopathy, previously followed with cardiology, has been unable to see them due to cost, patient recently lost insurance due to loss of his job. Current regimen: Has not been using hydralazine 100mg  TID, Coreg 6.25mg  BID. Has not been able to afford Entresto in the last 2 months. Denies chest pain, shortness of breath, headache, vision changes. BP has been better at home, has been in 132 SBP Has not been on Entresto since  Diet has been good, baking meats, trying to go more plant based   Cardiomyopathy Patient was supposed to have an echo in September, then reordered for December.  Has been unable to have this completed.  Has not been able to see Dr. Debara Pickett due to cost of co-pay.  Sexual Dysfunction Has noticed it in the last few months since he has been off Entresto More the drive is the problem but can achieve erection States that in the past few months, he has been very stressed by his current work situation as well as is a year since his father passed  Tobacco Abuse Decreasing, 3-4 days for 1 pack Doing this on his own  Disordered sleep Patient reports a history of disordered sleep, and has been using trazodone as needed at night for sleep aid for quite some time and has improvement with this.  He is asking for refill on his trazodone.   PERTINENT  PMH / PSH: Nonischemic cardiomyopathy, malignant hypertension  OBJECTIVE:   BP (!) 188/110   Pulse 66   Ht 5\' 3"  (1.6 m)   Wt 179 lb 6.4 oz (81.4 kg)   SpO2 99%   BMI 31.78 kg/m    Physical Exam:  General: 59 y.o. male in NAD Cardio: RRR Lungs: CTAB, no wheezing, no rhonchi, no crackles, no IWOB on RA Skin: warm and dry Extremities: No edema   ASSESSMENT/PLAN:   History of uncontrolled hypertension Hypertension is not well controlled today.  Patient does report slightly better control at home, but given his significantly elevated pressure today, still suspect  that he is not at goal at home.  He has been off of Entresto for 2 months.  Discussed with Dr. Valentina Lucks, who called over to pharmacist and was able to secure 1 bottle of Entresto for patient at Dr. Lysbeth Penner office.  Patient was advised to pick this up today.  Discussed performing labs with patient today, he reports that he cannot afford this at present, will order future labs for patient to come in as he is able.  We will see him back in 1 month for blood pressure recheck.  Continue hydralazine and Coreg as prescribed.  Nonischemic cardiomyopathy [facility]) Patient has a history of nonischemic cardiomyopathy, needs an echo, but cannot afford at present.  Delene Loll was able to be secured for patient as this will assist with mortality benefit.  Would like for him to get back to Dr. Debara Pickett soon as possible.  See no insurance plan below.  Decreased sex drive Patient reports decreased sex drive in the last 2 months.  Able to have an erection.  Suspect that this may be due to increased stress, patient also does not have well controlled medical problems at this time.  Would like to obtain a lipid panel on this patient, but he cannot afford at this time.  Will place as a future order for when he can afford it.  Discussed with patient possibility of therapy here at the office  given the cost is a large issue for him and he does not have insurance.  He would be interested in that.  Message sent to Dr. Shawnee Knapp to see if she can assist with this.  Tobacco abuse Congratulated on decrease in tobacco use.  Continue to encourage cessation.  Patient does not ask for any further assistance with this at this time.  Disordered sleep Patient has been well controlled on trazodone as needed.  Refill sent.  Does not have health insurance Patient recently lost his job and does not have health insurance.  This is a significant stressor on him and is affecting his medical care.  Discussed with Dr. Raymondo Band, who recommended CCM referral for  medication assistance in obtaining Entresto.  This referral was placed.  Patient also will receive Entresto today from Dr. Blanchie Dessert office.  Discussed with Sammuel Hines, LCSW, who gave patient resources for medication management as well as orange card information.     Unknown Jim, DO [facility]

## 2019-12-08 NOTE — Assessment & Plan Note (Signed)
Patient recently lost his job and does not have health insurance.  This is a significant stressor on him and is affecting his medical care.  Discussed with Dr. Raymondo Band, who recommended CCM referral for medication assistance in obtaining Entresto.  This referral was placed.  Patient also will receive Entresto today from Dr. Blanchie Dessert office.  Discussed with Sammuel Hines, LCSW, who gave patient resources for medication management as well as orange card information.

## 2019-12-08 NOTE — Telephone Encounter (Signed)
Contacted for support with samples  Entresto 97/103 for this patient with elevated BP in office today who has taken and responded well to this agent in the past.  Melissa agreed to support short term supply.  We will partner with [facility] Pharm[facility] team at [facility] to attempt long-term supply PAP.  Patient should be instructed to go to [facility] for sample pick-up.

## 2019-12-08 NOTE — Chronic Care Management (AMB) (Signed)
   Social Work  Care Management Consultation  12/08/2019 Name: Jason Pitts MRN: [id number] DOB: 1961/03/07  Jason Pitts is a 59 y.o. year old male who sees Meccariello, Solmon Ice, DO for primary care. LCSW was consulted by PCP for information /resources to assistance patient with resources. He is unemployed and has no Programmer, applications. Patient was not interviewed or contacted during this encounter.     Recommendation: After consultation with provider it is determined that patient may benefit from resources to assist with medication and insurance options.   Intervention:Relevant information and resources discussed and shared with provider. ( Orange card packet, affordable care act insurance contact information , Medication Assistance program phone number, and 30 day indigent medication option.  PCP will give information to patient.    Review of patient status, including review of consultants reports, relevant laboratory and other test results, and collaboration with appropriate care team members and the patient's provider was performed as part of comprehensive patient evaluation and provision of chronic care management services.    Plan:  1.The care management team is available to follow up with the patient if further intervention is needed.  2. Please consult with patient and place formal CCM referral  3. No further follow up required by LCSW at this time  Sammuel Hines, LCSW Chronic Care Coordination  [facility] / [facility]   [phone] 10:43 AM

## 2019-12-08 NOTE — Telephone Encounter (Signed)
Can we give him 2 bottles of the 49/51, he can take 2 tablets

## 2019-12-08 NOTE — Assessment & Plan Note (Signed)
Patient has been well controlled on trazodone as needed.  Refill sent.

## 2019-12-08 NOTE — Telephone Encounter (Signed)
Thank you :)

## 2019-12-08 NOTE — Assessment & Plan Note (Signed)
Hypertension is not well controlled today.  Patient does report slightly better control at home, but given his significantly elevated pressure today, still suspect that he is not at goal at home.  He has been off of Entresto for 2 months.  Discussed with Dr. Raymondo Band, who called over to pharmacist and was able to secure 1 bottle of Entresto for patient at Dr. Blanchie Dessert office.  Patient was advised to pick this up today.  Discussed performing labs with patient today, he reports that he cannot afford this at present, will order future labs for patient to come in as he is able.  We will see him back in 1 month for blood pressure recheck.  Continue hydralazine and Coreg as prescribed.

## 2019-12-08 NOTE — Assessment & Plan Note (Signed)
Patient reports decreased sex drive in the last 2 months.  Able to have an erection.  Suspect that this may be due to increased stress, patient also does not have well controlled medical problems at this time.  Would like to obtain a lipid panel on this patient, but he cannot afford at this time.  Will place as a future order for when he can afford it.  Discussed with patient possibility of therapy here at the office given the cost is a large issue for him and he does not have insurance.  He would be interested in that.  Message sent to Dr. Shawnee Knapp to see if she can assist with this.

## 2019-12-08 NOTE — Telephone Encounter (Signed)
Cindee Lame, turns out we do not have samples of 97/103- but I will reach out to the [facility] rep to see if we can get some.

## 2019-12-08 NOTE — Patient Instructions (Signed)
Thank you for coming to see me today. It was a pleasure. Today we talked about:   Pick up Entresto at Dr. Blanchie Dessert office.  Continue to check your blood pressure at home.  Ask for an orange card packet at the front desk.    Come back as you are able for blood work.  I have sent a message to our therapist.  She will hopefully contact you in the next 1-2 weeks.  Please follow-up with me in 1 month.  If you have any questions or concerns, please do not hesitate to call the office at [phone].  Best,   Luis Abed, DO

## 2019-12-08 NOTE — Assessment & Plan Note (Signed)
Congratulated on decrease in tobacco use.  Continue to encourage cessation.  Patient does not ask for any further assistance with this at this time.

## 2019-12-08 NOTE — Telephone Encounter (Signed)
We do not have samples of Entresto 97-103 mg tablets.

## 2020-01-08 ENCOUNTER — Ambulatory Visit (INDEPENDENT_AMBULATORY_CARE_PROVIDER_SITE_OTHER): Payer: Self-pay | Admitting: Family Medicine

## 2020-01-08 ENCOUNTER — Encounter: Payer: Self-pay | Admitting: Family Medicine

## 2020-01-08 ENCOUNTER — Other Ambulatory Visit: Payer: Self-pay

## 2020-01-08 VITALS — BP 178/110 | HR 64 | Ht 63.0 in | Wt 177.6 lb

## 2020-01-08 DIAGNOSIS — N62 Hypertrophy of breast: Secondary | ICD-10-CM

## 2020-01-08 DIAGNOSIS — Z13220 Encounter for screening for lipoid disorders: Secondary | ICD-10-CM

## 2020-01-08 DIAGNOSIS — Z8679 Personal history of other diseases of the circulatory system: Secondary | ICD-10-CM

## 2020-01-08 DIAGNOSIS — R4586 Emotional lability: Secondary | ICD-10-CM
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
## 2020-01-08 NOTE — Patient Instructions (Addendum)
I have filled out your [place] form.  Get your labs today.  Come back to see me in 1-2 months.  Outpatient Mental Health Providers (No Insurance required or Self Pay)  [facility]) can see uninsured folks for outpatient therapy [email or web address] [address] [place], [place] [address] [phone]   [facility]    Walk-in Mon-Fri, 8am-3pm [email or web address] Scull [address], [place], [place]  [phone]   [address]  [place] [phone] [facility] for psych med management, there may be a wait- if [facility] is working with clients for OPT, they will coordinate with [facility] for psych  [facility]   Walk-in-Clinic: Monday- Friday 9:00 AM - 4:00 PM [address]   [place], [place] [phone]  [facility] the [place] ([employer]) walk in M-F 8am-12pm and  1pm-3pm [place]- [address]     [phone]  [employer] -[address]  Phone: [phone]  [employer] (Mental Health and substance challenges) [address]   [place] [phone]    [email or web address]    [facility]  [facility] -[address], [place]     Phone:  [phone] [place]-  [address]  [phone]   [facility]  [address] [place]  [phone] [email or web address]   [facility] ( virtual or zoom therapy) [email or web address]  [address] [place]  [phone]    [facility] [phone]  grief counseling, dementia and caregiver support    [facility] Walk-in MWF 12:30 to 3:00     [address] [place] [address]  [phone]  [email or web address].[email or web address] call to schedule an appointment    Mental Health [facility] Classes ,Support group, Peer support services, [address], [place], [place] [address] [phone]  PhotoSolver.pl            National Alliance on Mental Illness (NAMI) [place]- Wellness classes, Support groups        [address], [place], [place] [address] [phone]   [email or web address]   [facility]  (Psycho-social Rehabilitation clubhouse, Individual and group therapy) [address] [place], [place] [address]   [phone]  24- Hour Availability:  [facility] [phone] or [phone] * [facility] [employer] (Domestic Violence, Rape, etc. )(239)123-6365 [place] [employer] [phone] or 5145142442 * [facility] [phone] only) [phone] (after hours) *[facility] Mobile Crisis Unit [phone] *[place] National Suicide Hotline 442-157-7539 Len Childs)

## 2020-01-09 LAB — BASIC METABOLIC PANEL
BUN/Creatinine Ratio: 14 (ref 10–24)
BUN: 16 mg/dL (ref 8–27)
CO2: 22 mmol/L (ref 20–29)
Calcium: 9.8 mg/dL (ref 8.6–10.2)
Chloride: 103 mmol/L (ref 96–106)
Creatinine, Ser: 1.12 mg/dL (ref 0.76–1.27)
GFR calc Af Amer: 82 mL/min/{1.73_m2} (ref >59)
GFR calc non Af Amer: 71 mL/min/{1.73_m2} (ref >59)
Glucose: 77 mg/dL (ref 65–99)
Potassium: 4.3 mmol/L (ref 3.5–5.2)
Sodium: 140 mmol/L (ref 134–144)

## 2020-01-09 LAB — LIPID PANEL
Chol/HDL Ratio: 4.2 ratio (ref 0.0–5.0)
Cholesterol, Total: 184 mg/dL (ref 100–199)
HDL: 44 mg/dL (ref >39)
LDL Chol Calc (NIH): 112 mg/dL — ABNORMAL HIGH (ref 0–99)
Triglycerides: 161 mg/dL — ABNORMAL HIGH (ref 0–149)
VLDL Cholesterol Cal: 28 mg/dL (ref 5–40)

## 2020-01-10 ENCOUNTER — Other Ambulatory Visit: Payer: Self-pay | Admitting: Family Medicine

## 2020-01-10 DIAGNOSIS — R4586 Emotional lability: Secondary | ICD-10-CM | POA: Insufficient documentation

## 2020-01-10 DIAGNOSIS — N62 Hypertrophy of breast: Secondary | ICD-10-CM | POA: Insufficient documentation

## 2020-01-10 DIAGNOSIS — Z13220 Encounter for screening for lipoid disorders: Secondary | ICD-10-CM | POA: Insufficient documentation

## 2020-01-10 DIAGNOSIS — E782 Mixed hyperlipidemia: Secondary | ICD-10-CM

## 2020-01-10 MED ORDER — ATORVASTATIN CALCIUM 40 MG PO TABS: 40.0000 mg | ORAL_TABLET | Freq: Every day | ORAL | 3 refills | Status: AC

## 2020-01-10 NOTE — Assessment & Plan Note (Signed)
Concern that patient has possible underlying bipolar disorder.  This is complicated by the fact that he does not currently have a job and does not currently have insurance.  Patient provided information for therapist and psychiatrist in the area who she be able to work with him given that he does not have insurance.  He will need close monitoring.  We will have him return in 1 month to ensure that he has followed up with a psychiatrist.  He does not seem to be in a manic or depressive episode today.

## 2020-01-10 NOTE — Assessment & Plan Note (Signed)
Last lipid panel over 1 year ago.  Will obtain lipid panel today.

## 2020-01-10 NOTE — Assessment & Plan Note (Signed)
Given patient's report of increase in size of left breast tissue and recent pain, will mammogram would be appropriate.  This was ordered and patient was given instructions on how to schedule.

## 2020-01-10 NOTE — Assessment & Plan Note (Signed)
Patient's blood pressure was previously well controlled when he was on Entresto as well as hydralazine and Coreg.  Form completed for approval of Entresto.  Hopeful that patient can get Entresto soon.  He does not want to start a medication in the meantime.  Reports that his blood pressures at home are better than they are here.  We will follow-up in 1 month.

## 2020-01-10 NOTE — Progress Notes (Signed)
The 10-year ASCVD risk score Denman George DC Montez Hageman., et al., 2013) is: 37.8%   Values used to calculate the score:     Age: 59 years     Sex: Male     Is Non-Hispanic African American: Yes     Diabetic: No     Tobacco smoker: Yes     Systolic Blood Pressure: 178 mmHg     Is BP treated: Yes     HDL Cholesterol: 44 mg/dL     Total Cholesterol: 184 mg/dL  Initiate atorvastatin 40 and recheck in 6 weeks.

## 2020-01-11 MED FILL — ATORVASTATIN 40 MG TABLET: 40 | 90 days supply | Qty: 90 | Fill #0

## 2020-01-12 ENCOUNTER — Other Ambulatory Visit: Payer: Self-pay | Admitting: Family Medicine

## 2020-01-12 DIAGNOSIS — N62 Hypertrophy of breast: Secondary | ICD-10-CM

## 2020-01-19 MED FILL — hydrALAZINE HCL 50 MG TABS: 50 | 30 days supply | Qty: 180 | Fill #1
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
## 2020-02-26 ENCOUNTER — Other Ambulatory Visit: Payer: Self-pay

## 2020-02-26 ENCOUNTER — Ambulatory Visit (INDEPENDENT_AMBULATORY_CARE_PROVIDER_SITE_OTHER): Payer: Self-pay | Admitting: Family Medicine

## 2020-02-26 ENCOUNTER — Encounter: Payer: Self-pay | Admitting: Family Medicine

## 2020-02-26 VITALS — BP 138/84 | HR 84 | Ht 63.0 in | Wt 177.8 lb

## 2020-02-26 DIAGNOSIS — Z1211 Encounter for screening for malignant neoplasm of colon: Secondary | ICD-10-CM

## 2020-02-26 DIAGNOSIS — I10 Essential (primary) hypertension: Secondary | ICD-10-CM

## 2020-02-26 DIAGNOSIS — E782 Mixed hyperlipidemia: Secondary | ICD-10-CM | POA: Insufficient documentation

## 2020-02-26 DIAGNOSIS — R4586 Emotional lability: Secondary | ICD-10-CM

## 2020-02-26 NOTE — Assessment & Plan Note (Signed)
Doing better from the standpoint.  No intervention needed at this time.  PHQ 2 score 0.  We will continue to follow with the patient and his wife.

## 2020-02-26 NOTE — Assessment & Plan Note (Addendum)
Patient blood pressure control now very improved with addition of Entresto which has been approved for 1 year.  Currently taking Coreg 6.25 twice daily, Hydrea 50 mg 3 times daily, Entresto 97-103.  We will continue with these medications.  Encourage patient to follow-up with his cardiologist Dr. Rennis Golden now that he has insurance.

## 2020-02-26 NOTE — Assessment & Plan Note (Signed)
Has been on Lipitor 40 now for 1 month, has been tolerating this well without problems.  Has also been eating well.  We will repeat LDL at next visit in 2 to 3 months.

## 2020-02-26 NOTE — Assessment & Plan Note (Signed)
Not patient has insurance, encourage colonoscopy.  Patient agrees to this.  He was given colonoscopy form.

## 2020-02-26 NOTE — Patient Instructions (Signed)
Thank you for coming to see me today. It was a pleasure. Today we talked about:   Congrats on your Blood Pressure!  Everything looks great!  Call Dr. Rennis Golden to schedule an appointment.  You are due for a colonoscopy.  Please use the form that we have given you to schedule this at your convenience.   Please follow-up with me in 2-3 months and we will recheck your cholesterol and your prostate.  If you have any questions or concerns, please do not hesitate to call the office at [phone].  Best,   Luis Abed, DO    Dental list         Updated 11.20.18 These dentists all accept Medicaid.  The list is a courtesy and for your convenience. Estos dentistas aceptan Medicaid.  La lista es para su [place] y es una cortesa.     [facility]     [phone] [address]  [address] [place] [address] Se habla espaol From 76 to 83 years old Parent may go with child only for cleaning Vinson Moselle DDS     [phone] Milus Banister, DDS (Spanish speaking) [address] [place]  [address] Se habla espaol From 87 to 37 years old Parent may go with child   Marolyn Hammock DMD    [phone] [address] [place] [address] Se habla espaol [place] spoken From 38 years old Parent may go with child [facility]     [phone] [address] [place] [address] Se habla espaol From 108 to 68 years old Parent may NOT go with child  Winfield Rast DDS  [phone] [facility]      [address]  [place] [address] Se habla espaol [place] spoken (preferred to bring translator) From teeth coming in to 54 years old Parent may go with child  [facility]     [phone] [address] [place]. [place] [address] Requires certification. Call for information. Requiere certificacin. Llame para informacin. Algunos dias se habla espaol  From birth to 20 years Parent possibly goes  with child   Bradd Canary DDS     [phone] [id number] [place]  [address] [place] [address] Se habla espaol From 18 months to 18 years  Parent may go with child  J. [facility] DDS     Garlon Hatchet DDS  [phone] [address] [place] [address] Se habla espaol From 24 year old Parent may go with child   Melynda Ripple DDS    [phone] [address] [place] [address] Se habla espaol  From 18 months to 44 years old Parent may go with child Dorian Pod DDS    [phone] [address] [place] [address] Se habla espaol From 48 to 11 years old Parent may go with child  [facility]    [phone] [address] [place] [address] No se Wayne Sever From birth [facility]  [phone] [address] [place] [address] Se habla espanol Interpretation for other languages Special needs children welcome  Geryl Councilman, DDS PA     [phone]  [place], [place] [address] From 60 years old   Special needs children welcome  [facility]   [phone] Dr. Orlean Patten [address] [place], [place] [address] Se habla espaol From birth to 12 years Special needs children welcome   [facility] [phone]  [address] [place], [place] [address]   [facility]th [facility]a [phone] [address] [place], [place] [address]

## 2020-02-29 ENCOUNTER — Encounter: Payer: Self-pay | Admitting: Physician Assistant

## 2020-03-07 ENCOUNTER — Ambulatory Visit (INDEPENDENT_AMBULATORY_CARE_PROVIDER_SITE_OTHER): Payer: Self-pay | Admitting: Internal Medicine

## 2020-03-07 ENCOUNTER — Other Ambulatory Visit: Payer: Self-pay

## 2020-03-07 ENCOUNTER — Encounter: Payer: Self-pay | Admitting: Internal Medicine

## 2020-03-07 VITALS — BP 160/92 | HR 67 | Temp 97.2°F | Ht 62.0 in | Wt 179.4 lb

## 2020-03-07 DIAGNOSIS — I10 Essential (primary) hypertension: Secondary | ICD-10-CM

## 2020-03-07 DIAGNOSIS — I428 Other cardiomyopathies: Secondary | ICD-10-CM

## 2020-03-07 DIAGNOSIS — I5022 Chronic systolic (congestive) heart failure: Secondary | ICD-10-CM

## 2020-03-07 DIAGNOSIS — I77810 Thoracic aortic ectasia: Secondary | ICD-10-CM

## 2020-03-07 NOTE — Progress Notes (Signed)
OFFICE FOLLOW-UP NOTE  Chief Complaint:  Follow-up heart failure  Primary Care Physician: Unknown Jim, DO  HPI:  Jason Pitts is a 59 y.o. male with a past medial history significant for chest pain, tobacco abuse, malignant hypertension, family history of premature coronary artery disease, and recent admission for chest pain. He ruled out for MI and underwent a nuclear stress test which was negative for ischemia however showed severely dilated LV with global hypokinesis and EF of 26%. Echo on 03/05/2017 showed an EF of 30-35% with global hypokinesis and a dilated aortic root of 4.3 cm. He was placed on appropriate medications including BiDil, but reported that was too expensive for him and did not give the medicine filled. Today he returns for follow-up. Blood pressure initially was elevated at 180/124. He denies headache, blurred vision, decreased urine output or chest pain.  06/21/2017  Jason Pitts returns today for follow-up of his heart failure.  Overall he seems to be doing well.  He denies any worsening shortness of breath or chest pain.  Unfortunately discontinued carvedilol for unknown reasons.  He did not do his follow-up blood work because he had a death in the family but plans to get that blood work drawn when he sees Dr. Chanetta Marshall in an upcoming visit.  He is heart rate was noted to be low in the 50s recently and they deferred restarting his beta-blocker however it is elevated again today and he would benefit from a cardiac standpoint from that.  In addition he would benefit from up titration of his Entresto.  Blood pressure today is 162/90 and at his lowest he said was about 127 systolic.  05/15/2018  Jason Pitts is seen today for follow-up heart failure.  Overall he endorses NYHA class I to maximally class II symptoms.  He is able to continue to work without any issues.  He denies chest pain or worsening shortness of breath.  His blood pressure has remained elevated and was  145/93 today.  He is on the moderate dose of Entresto.  He was recommended to have a repeat echo however that has not yet been performed.  07/20/2019  Jason Pitts is seen today in follow-up.  He continues to have poorly controlled hypertension.  When I last saw him via virtual visit his blood pressure was normal however nearly every other visit in the office shows poorly controlled hypertension.  He does have a history of cardiomyopathy with EF 30 to 35% but that has not been reassessed since 2018.  He denies any significant heart failure symptoms but does get fatigue and shortness of breath with marked exertion, suggestive of NYHA class II symptoms.  Today blood pressure significantly elevated again at 175/117.  He recently had some medications added by his family medicine resident physicians, including either amlodipine or changes in the hydralazine.  He is not sure that the medicine makes him feel that well.  03/07/2020  Jason Pitts is seen today in follow-up.  He did not follow-up initially because he lost his job at [employer].  He is also recently established Medicaid and does have insurance coverage.  He was able to get Entresto through there support program and has been on the medication for about a month and a half.  Blood pressure was elevated today but however he says it is better tolerated.  He wishes to start working and needs working restriction to weight lifting less than 20 pounds.  He should be able to accomplish this type of  work.  If not reassess his LV function.  The plan was to do that by echo but since he just started his Sherryll Burger, it is a little early to do that.  PMHx:  Past Medical History:  Diagnosis Date  . Acute on chronic combined systolic and diastolic CHF (congestive heart failure) (HCC) 06/21/2017  . Chest pain 03/04/2017  . Dysuria 01/06/2018  . Family history of early CAD   . HTN (hypertension)   . Irregular cardiac rhythm 05/15/2017  . 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  Tobacco use     Past Surgical History:  Procedure Laterality Date  . MANDIBLE FRACTURE SURGERY      FAMHx:  Family History  Problem Relation Age of Onset  . Heart failure Father   . CAD Father        94's    SOCHx:   reports that he has been smoking. He has a 12.00 pack-year smoking history. He has never used smokeless tobacco. He reports current alcohol use. He reports that he does not use drugs.  ALLERGIES:  No Known Allergies  ROS: Pertinent items noted in HPI and remainder of comprehensive ROS otherwise negative.  HOME MEDS: Current Outpatient Medications on File Prior to Visit  Medication Sig Dispense Refill  . aspirin EC 81 MG tablet Take 1 tablet (81 mg total) by mouth daily. 90 tablet 3  . atorvastatin (LIPITOR) 40 MG tablet Take 1 tablet (40 mg total) by mouth daily. 90 tablet 3  . carvedilol (COREG) 6.25 MG tablet Take 1 tablet (6.25 mg total) by mouth 2 (two) times daily with a meal. 180 tablet 3  . ENTRESTO 97-103 MG Take 1 tablet by mouth twice daily 60 tablet 9  . famotidine (PEPCID) 20 MG tablet Take 1 tablet (20 mg total) by mouth 2 (two) times daily. 30 tablet 0  . hydrALAZINE (APRESOLINE) 50 MG tablet Take 2 tablets (100 mg total) by mouth 3 (three) times daily. 90 tablet 3  . Multiple Vitamin (MULTIVITAMIN) tablet Take 1 tablet by mouth daily.    . ondansetron (ZOFRAN-ODT) 4 MG disintegrating tablet Take 1 tablet (4 mg total) by mouth every 8 (eight) hours as needed for nausea or vomiting. 20 tablet 0  . traZODone (DESYREL) 50 MG tablet Take 0.5-1 tablets (25-50 mg total) by mouth at bedtime as needed for sleep. 30 tablet 3   No current facility-administered medications on file prior to visit.    LABS/IMAGING: No results found for this or any previous visit (from the past 48 hour(s)). No results found.  LIPID PANEL:    Component Value Date/Time   CHOL 184 01/08/2020 1214   TRIG 161 (H) 01/08/2020 1214   HDL 44 01/08/2020 1214   CHOLHDL 4.2  01/08/2020 1214   CHOLHDL 3.6 03/04/2017 0853   VLDL 17 03/04/2017 0853   LDLCALC 112 (H) 01/08/2020 1214     WEIGHTS: Wt Readings from Last 3 Encounters:  03/07/20 179 lb 6.4 oz (81.4 kg)  02/26/20 177 lb 12.8 oz (80.6 kg)  01/08/20 177 lb 9.6 oz (80.6 kg)    VITALS: BP (!) 160/92   Pulse 67   Temp (!) 97.2 F (36.2 C)   Ht 5\' 2"  (1.575 m)   Wt 179 lb 6.4 oz (81.4 kg)   SpO2 92%   BMI 32.81 kg/m   EXAM: General appearance: alert and no distress Neck: no carotid bruit, no JVD and thyroid not enlarged, symmetric, no tenderness/mass/nodules Lungs: clear to auscultation bilaterally Heart: regular  rate and rhythm, S1, S2 normal, no murmur, click, rub or gallop Abdomen: soft, non-tender; bowel sounds normal; no masses,  no organomegaly Extremities: extremities normal, atraumatic, no cyanosis or edema Pulses: 2+ and symmetric Skin: Skin color, texture, turgor normal. No rashes or lesions Neurologic: Grossly normal Psych: Pleasant  EKG: Sinus rhythm at 67, LVH by voltage, ST and T wave changes-personally reviewed  ASSESSMENT: 1. Uncontrolled hypertension 2. Acute systolic congestive heart failure -of EF 30-35%, dilated ventricle 3. 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 Intermediate, but acceptable risk for colonoscopy  PLAN: 1.  Jason Pitts had recently started on Entresto and is on carvedilol.  Blood pressure was still elevated today.  Will plan a repeat echo in September and follow-up with me afterwards.  I may need to further adjust his medications.  This should be enough time on the medication to see if there is an improvement in LV function.  He will also need follow-up of dilated aortic root, measuring 4.3 cm.  Follow-up with me in 3 months.  Chrystie Nose, MD, [facility], FACP  [place]  [facility]  Medical Director of the Advanced Lipid Disorders &  Cardiovascular Risk Reduction Clinic Diplomate of the American Board of Clinical Lipidology Attending Cardiologist    Direct Dial: [phone]  Fax: [phone]  Website:  [email or web address] Nicely Evonda Enge 03/07/2020, 3:18 PM

## 2020-03-07 NOTE — Patient Instructions (Signed)
Medication Instructions:  Your physician recommends that you continue on your current medications as directed. Please refer to the Current Medication list given to you today.  *If you need a refill on your cardiac medications before your next appointment, please call your pharmacy*   Testing/Procedures: Echocardiogram in September 2021 @ [address] 3rd Floor   Follow-Up: At [facility], you and your health needs are our priority.  As part of our continuing mission to provide you with exceptional heart care, we have created designated Provider Care Teams.  These Care Teams include your primary Cardiologist (physician) and Advanced Practice Providers (APPs -  Physician Assistants and Nurse Practitioners) who all work together to provide you with the care you need, when you need it.  We recommend signing up for the patient portal called "MyChart".  Sign up information is provided on this After Visit Summary.  MyChart is used to connect with patients for Virtual Visits (Telemedicine).  Patients are able to view lab/test results, encounter notes, upcoming appointments, etc.  Non-urgent messages can be sent to your provider as well.   To learn more about what you can do with MyChart, go to [email or web address].    Your next appointment:   3 month(s)  The format for your next appointment:   In Person  Provider:   You may see Chrystie Nose, MD or one of the following Advanced Practice Providers on your designated Care Team:    Azalee Course, PA-C  Micah Flesher, PA-C or   Judy Pimple, [place]    Other Instructions

## 2020-03-10 ENCOUNTER — Other Ambulatory Visit (HOSPITAL_COMMUNITY): Payer: Medicaid Other

## 2020-03-30 ENCOUNTER — Ambulatory Visit: Payer: Medicaid Other | Admitting: Physician Assistant

## 2020-05-03 ENCOUNTER — Ambulatory Visit: Payer: Medicaid Other | Admitting: Nurse Practitioner

## 2020-05-11 ENCOUNTER — Other Ambulatory Visit (HOSPITAL_COMMUNITY): Payer: Medicaid Other

## 2020-05-11 ENCOUNTER — Encounter (HOSPITAL_COMMUNITY): Payer: Self-pay | Admitting: Internal Medicine

## 2020-05-11 ENCOUNTER — Encounter (HOSPITAL_COMMUNITY): Payer: Self-pay

## 2020-05-11 NOTE — Progress Notes (Signed)
Verified appointment "no show" status with D. Price at 14:00.

## 2020-05-13 ENCOUNTER — Other Ambulatory Visit: Payer: Medicaid Other

## 2020-05-13 DIAGNOSIS — Z20822 Contact with and (suspected) exposure to covid-19: Secondary | ICD-10-CM

## 2020-05-14 LAB — SARS-COV-2, NAA 2 DAY TAT

## 2020-05-14 LAB — NOVEL CORONAVIRUS, NAA: SARS-CoV-2, NAA: NOT DETECTED

## 2020-05-17 ENCOUNTER — Telehealth: Payer: Self-pay | Admitting: Family Medicine

## 2020-05-17 NOTE — Telephone Encounter (Signed)
Pt aware covid lab test negative, covid not detected 

## 2020-05-23 ENCOUNTER — Telehealth (HOSPITAL_COMMUNITY): Payer: Self-pay | Admitting: Internal Medicine

## 2020-05-23 NOTE — Telephone Encounter (Signed)
Just an FYI. We have made several attempts to contact this patient including sending a letter to schedule or reschedule their echocardiogram. We will be removing the patient from the echo WQ.    05/11/20 NO SHOWED -MAILED LETTER LBW    Thank you

## 2020-05-24 ENCOUNTER — Other Ambulatory Visit: Payer: Self-pay

## 2020-05-24 ENCOUNTER — Emergency Department (HOSPITAL_COMMUNITY)
Admission: EM | Admit: 2020-05-24 | Discharge: 2020-05-24 | Disposition: A | Discharge status: Home / Self Care | Payer: Medicaid Other | Attending: Emergency Medicine | Admitting: Emergency Medicine

## 2020-05-24 ENCOUNTER — Encounter (HOSPITAL_COMMUNITY): Payer: Self-pay

## 2020-05-24 ENCOUNTER — Emergency Department (HOSPITAL_COMMUNITY): Payer: Medicaid Other

## 2020-05-24 DIAGNOSIS — T50904A Poisoning by unspecified drugs, medicaments and biological substances, undetermined, initial encounter: Secondary | ICD-10-CM

## 2020-05-24 DIAGNOSIS — R0681 Apnea, not elsewhere classified: Secondary | ICD-10-CM | POA: Insufficient documentation

## 2020-05-24 DIAGNOSIS — Z79899 Other long term (current) drug therapy: Secondary | ICD-10-CM | POA: Insufficient documentation

## 2020-05-24 DIAGNOSIS — I110 Hypertensive heart disease with heart failure: Secondary | ICD-10-CM | POA: Insufficient documentation

## 2020-05-24 DIAGNOSIS — F17200 Nicotine dependence, unspecified, uncomplicated: Secondary | ICD-10-CM | POA: Insufficient documentation

## 2020-05-24 DIAGNOSIS — I5043 Acute on chronic combined systolic (congestive) and diastolic (congestive) heart failure: Secondary | ICD-10-CM | POA: Insufficient documentation

## 2020-05-24 DIAGNOSIS — Z7982 Long term (current) use of aspirin: Secondary | ICD-10-CM | POA: Insufficient documentation

## 2020-05-24 DIAGNOSIS — T50901A Poisoning by unspecified drugs, medicaments and biological substances, accidental (unintentional), initial encounter: Secondary | ICD-10-CM | POA: Insufficient documentation

## 2020-05-24 LAB — CBC WITH DIFFERENTIAL/PLATELET
Abs Immature Granulocytes: 0.03 10*3/uL (ref 0.00–0.07)
Basophils Absolute: 0 10*3/uL (ref 0.0–0.1)
Basophils Relative: 0 %
Eosinophils Absolute: 0.1 10*3/uL (ref 0.0–0.5)
Eosinophils Relative: 1 %
HCT: 36.7 % — ABNORMAL LOW (ref 39.0–52.0)
Hemoglobin: 11.1 g/dL — ABNORMAL LOW (ref 13.0–17.0)
Immature Granulocytes: 0 %
Lymphocytes Relative: 13 %
Lymphs Abs: 1.1 10*3/uL (ref 0.7–4.0)
MCH: 23.5 pg — ABNORMAL LOW (ref 26.0–34.0)
MCHC: 30.2 g/dL (ref 30.0–36.0)
MCV: 77.8 fL — ABNORMAL LOW (ref 80.0–100.0)
Monocytes Absolute: 0.6 10*3/uL (ref 0.1–1.0)
Monocytes Relative: 7 %
Neutro Abs: 6.7 10*3/uL (ref 1.7–7.7)
Neutrophils Relative %: 79 %
Platelets: 218 10*3/uL (ref 150–400)
RBC: 4.72 MIL/uL (ref 4.22–5.81)
RDW: 17.5 % — ABNORMAL HIGH (ref 11.5–15.5)
WBC: 8.5 10*3/uL (ref 4.0–10.5)
nRBC: 0 % (ref 0.0–0.2)

## 2020-05-24 LAB — ETHANOL: Alcohol, Ethyl (B): 137 mg/dL — ABNORMAL HIGH (ref <10)

## 2020-05-24 LAB — BASIC METABOLIC PANEL
Anion gap: 11 (ref 5–15)
BUN: 15 mg/dL (ref 6–20)
CO2: 24 mmol/L (ref 22–32)
Calcium: 8.7 mg/dL — ABNORMAL LOW (ref 8.9–10.3)
Chloride: 105 mmol/L (ref 98–111)
Creatinine, Ser: 1.36 mg/dL — ABNORMAL HIGH (ref 0.61–1.24)
GFR calc non Af Amer: 56 mL/min — ABNORMAL LOW (ref >60)
Glucose, Bld: 91 mg/dL (ref 70–99)
Potassium: 3.7 mmol/L (ref 3.5–5.1)
Sodium: 140 mmol/L (ref 135–145)

## 2020-05-24 LAB — RAPID URINE DRUG SCREEN, HOSP PERFORMED
Amphetamines: NOT DETECTED
Barbiturates: NOT DETECTED
Benzodiazepines: NOT DETECTED
Cocaine: NOT DETECTED
Opiates: NOT DETECTED
Tetrahydrocannabinol: NOT DETECTED

## 2020-05-24 LAB — TROPONIN I (HIGH SENSITIVITY): Troponin I (High Sensitivity): 26 ng/L — ABNORMAL HIGH (ref <18)

## 2020-05-24 NOTE — ED Provider Notes (Signed)
[facility] EMERGENCY DEPARTMENT Provider Note   CSN: [id number] Arrival date & time: 05/24/20  1818     History Chief Complaint  Patient presents with  . Drug Overdose    Jason Pitts is a 59 y.o. male.  59 year old male with prior medical history as detailed below presents for evaluation.  Patient apparently was found in his car in his driveway.  Patient was not driving at the time.  Patient's car was locked.  Patient was found to be unresponsive inside his vehicle.  Fire and EMS had to break the window to access him.  He was found to be apneic upon their initial evaluation.  Narcan was administered with improvement noted immediately.  Pupils were initially pinpoint per EMS.  Patient reports drinking "some cognac" earlier today.  He also reports "smoking some pot."  He denies use of narcotics.  The history is provided by the patient, the EMS personnel and medical records.  Drug Overdose This is a new problem. The current episode started 1 to 2 hours ago. The problem occurs constantly. The problem has been rapidly improving. Nothing aggravates the symptoms. Nothing relieves the symptoms.       Past Medical History:  Diagnosis Date  . Acute on chronic combined systolic and diastolic CHF (congestive heart failure) (HCC) 06/21/2017  . Chest pain 03/04/2017  . Dysuria 01/06/2018  . Family history of early CAD   . HTN (hypertension)   . Irregular cardiac rhythm 05/15/2017  . 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 Tobacco use     Patient Active Problem List   Diagnosis Date Noted  . Resistant hypertension 02/26/2020  . Mixed hyperlipidemia 02/26/2020  . Screen for colon cancer 02/26/2020  . Gynecomastia 01/10/2020  . Mood disturbance 01/10/2020  . Decreased sex drive 01/60/1093  . Rash and nonspecific skin eruption 08/03/2019  . Nausea & vomiting 04/17/2019  . Essential hypertension 02/26/2019  . History of prediabetes 02/26/2019  . Memory loss of unknown  cause 09/10/2018  . Adjustment insomnia 09/10/2018  . Dilated aortic root (HCC) 05/15/2018  . Disordered sleep 12/05/2017  . Nonischemic cardiomyopathy (HCC) 03/05/2017  . 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 Tobacco abuse 02/15/2017    Past Surgical History:  Procedure Laterality Date  . MANDIBLE FRACTURE SURGERY         Family History  Problem Relation Age of Onset  . Heart failure Father   . CAD Father        81's    Social History   Tobacco Use  . Smoking status: Current Every Day Smoker    Packs/day: 0.30    Years: 40.00    Pack years: 12.00  . Smokeless tobacco: Never Used  Substance Use Topics  . Alcohol use: Yes  . Drug use: Yes    Types: Marijuana    Home Medications Prior to Admission medications   Medication Sig Start Date End Date Taking? Authorizing Provider  aspirin EC 81 MG tablet Take 1 tablet (81 mg total) by mouth daily. 05/15/18   Hilty, Lisette Abu, MD  atorvastatin (LIPITOR) 40 MG tablet Take 1 tablet (40 mg total) by mouth daily. 01/10/20   Meccariello, Solmon Ice, DO  carvedilol (COREG) 6.25 MG tablet Take 1 tablet (6.25 mg total) by mouth 2 (two) times daily with a meal. 11/26/19   Meccariello, Solmon Ice, DO  ENTRESTO 97-103 MG Take 1 tablet by mouth twice daily 09/07/19   Hilty, Lisette Abu, MD  famotidine (PEPCID) 20 MG tablet Take 1  tablet (20 mg total) by mouth 2 (two) times daily. 03/05/19   Henderly, Britni A, PA-C  hydrALAZINE (APRESOLINE) 50 MG tablet Take 2 tablets (100 mg total) by mouth 3 (three) times daily. 11/26/19   Meccariello, Solmon Ice, DO  Multiple Vitamin (MULTIVITAMIN) tablet Take 1 tablet by mouth daily.    [provider]  ondansetron (ZOFRAN-ODT) 4 MG disintegrating tablet Take 1 tablet (4 mg total) by mouth every 8 (eight) hours as needed for nausea or vomiting. 04/17/19   Caro Laroche, DO  traZODone (DESYREL) 50 MG tablet Take 0.5-1 tablets (25-50 mg total) by mouth at bedtime as needed for sleep. 12/08/19   Meccariello,  Solmon Ice, DO    Allergies    Patient has no known allergies.  Review of Systems   Review of Systems  All other systems reviewed and are negative.   Physical Exam Updated Vital Signs BP (!) 158/116 (BP Location: Right Arm)   Pulse 68   Resp 17   SpO2 96%   Physical Exam Vitals and nursing note reviewed.  Constitutional:      General: He is not in acute distress.    Appearance: He is well-developed.     Comments: Mild slurring of speech consistent with ETOH intoxication   HENT:     Head: Normocephalic and atraumatic.  Eyes:     Conjunctiva/sclera: Conjunctivae normal.     Pupils: Pupils are equal, round, and reactive to light.  Cardiovascular:     Rate and Rhythm: Normal rate and regular rhythm.     Heart sounds: Normal heart sounds.  Pulmonary:     Effort: Pulmonary effort is normal. No respiratory distress.     Breath sounds: Normal breath sounds.  Abdominal:     General: There is no distension.     Palpations: Abdomen is soft.     Tenderness: There is no abdominal tenderness.  Musculoskeletal:        General: No deformity. Normal range of motion.     Cervical back: Normal range of motion and neck supple.  Skin:    General: Skin is warm and dry.  Neurological:     Mental Status: He is alert and oriented to person, place, and time.     ED Results / Procedures / Treatments   Labs (all labs ordered are listed, but only abnormal results are displayed) Labs Reviewed  ETHANOL - Abnormal; Notable for the following components:      Result Value   Alcohol, Ethyl (B) 137 (*)    All other components within normal limits  BASIC METABOLIC PANEL - Abnormal; Notable for the following components:   Creatinine, Ser 1.36 (*)    Calcium 8.7 (*)    GFR calc non Af Amer 56 (*)    All other components within normal limits  CBC WITH DIFFERENTIAL/PLATELET - Abnormal; Notable for the following components:   Hemoglobin 11.1 (*)    HCT 36.7 (*)    MCV 77.8 (*)    MCH 23.5 (*)      RDW 17.5 (*)    All other components within normal limits  TROPONIN I (HIGH SENSITIVITY) - Abnormal; Notable for the following components:   Troponin I (High Sensitivity) 26 (*)    All other components within normal limits  RAPID URINE DRUG SCREEN, HOSP PERFORMED  TROPONIN I (HIGH SENSITIVITY)    EKG EKG Interpretation  Date/Time:  Tuesday May 24 2020 18:25:12 EDT Ventricular Rate:  74 PR Interval:    QRS  Duration: 119 QT Interval:  435 QTC Calculation: 483 R Axis:   88 Text Interpretation: Sinus or ectopic atrial rhythm Prolonged PR interval Consider left atrial enlargement Left ventricular hypertrophy Anterior infarct, old Borderline T abnormalities, inferior leads Baseline wander in lead(s) V5 Confirmed by Kristine Royal [phone]) on 05/24/2020 6:26:57 PM   Radiology DG Chest Port 1 View  Result Date: 05/24/2020 CLINICAL DATA:  Dyspnea found unresponsive EXAM: PORTABLE CHEST 1 VIEW COMPARISON:  07/22/2018 FINDINGS: Mild cardiomegaly. No focal opacity or pleural effusion. Aortic atherosclerosis. No pneumothorax. IMPRESSION: No active disease. Mild cardiomegaly. Electronically Signed   By: Jasmine Pang M.D.   On: 05/24/2020 18:48    Procedures Procedures (including critical care time)  Medications Ordered in ED Medications - No data to display  ED Course  I have reviewed the triage vital signs and the nursing notes.  Pertinent labs & imaging results that were available during my care of the patient were reviewed by me and considered in my medical decision making (see chart for details).    MDM Rules/Calculators/A&P                          MDM  Screen complete  Jason Pitts was evaluated in Emergency Department on 05/24/2020 for the symptoms described in the history of present illness. He was evaluated in the context of the global COVID-19 pandemic, which necessitated consideration that the patient might be at risk for infection with the SARS-CoV-2 virus that  causes COVID-19. Institutional protocols and algorithms that pertain to the evaluation of patients at risk for COVID-19 are in a state of rapid change based on information released by regulatory bodies including the CDC and federal and state organizations. These policies and algorithms were followed during the patient's care in the ED.  Patient is presenting for evaluation following a suspected overdose.  Patient does report both alcohol and possible marijuana use.  Patient did improve significantly with Narcan administration by EMS.  Patient was being observed in the ED.  He is clinical status improved significantly.  He has evidence of alcohol intoxication on his initial labs.  After a period of observation he felt improved and declined a further observation.  He was advised that further observation would be most appropriate and the safest option (until workup was complete).  Patient declined further observation of treatment.  He had capacity at time of signing his AMA to leave AMA.  He and his family members present at bedside understood that coingestion of alcohol with other drugs could result in significant morbidity and or death.  Final Clinical Impression(s) / ED Diagnoses Final diagnoses:  Drug overdose, undetermined intent, initial encounter    Rx / DC Orders ED Discharge Orders    None       Wynetta Fines, MD 05/24/20 2233

## 2020-05-24 NOTE — Social Work (Addendum)
CSW provided Pt with transportation to home.  CSW utilized time spent with Pt to attempt to counsel about substance use disorder. Pt denied substance use issues. Pt declined SUD resources.

## 2020-05-24 NOTE — ED Notes (Addendum)
Pt requesting to leave AMA, Messick, MD made aware and spoke with pt regarding request. Pt made aware of need to assess gait prior to discharge. Pt spouse confirmed transportation home.

## 2020-05-24 NOTE — ED Triage Notes (Signed)
Pt BIB [facility], found unresponsive and apneic in his car in his driveway, [facility] had to bust window. Pupils were pinpoint, gave 4mg  IM narcan and 1mg  Narcan IV. Pt then woke up, began having runs of VT. Pt confused as to location. Admits to smoking weed, unsure if it was laced with anything. Pt reports drinking Cognac   BP 160/100 HR 70 100% RA  CBG 138  18g LAC

## 2020-05-24 NOTE — ED Notes (Addendum)
Per pt spouse at bedside, pt sees Hilty, MD pt cardiologist. Hx CHF, CAD

## 2020-05-24 NOTE — ED Notes (Signed)
Social work called regarding pt request for transportation home.

## 2020-05-31 MED FILL — hydrALAZINE HCL 50 MG TABS: 50 | 15 days supply | Qty: 90 | Fill #2

## 2020-06-08 ENCOUNTER — Ambulatory Visit: Payer: Self-pay | Admitting: Internal Medicine

## 2020-06-10 ENCOUNTER — Telehealth (HOSPITAL_COMMUNITY): Payer: Self-pay | Admitting: Radiology

## 2020-06-10 MED FILL — hydrALAZINE HCL 50 MG TABS: 50 | 15 days supply | Qty: 90 | Fill #2

## 2020-06-10 NOTE — Telephone Encounter (Signed)
Returning patient's call. Unable to leave a message.

## 2020-06-17 ENCOUNTER — Other Ambulatory Visit: Payer: Self-pay

## 2020-06-17 ENCOUNTER — Ambulatory Visit (HOSPITAL_COMMUNITY): Payer: Self-pay | Attending: Cardiology

## 2020-06-17 DIAGNOSIS — I428 Other cardiomyopathies: Secondary | ICD-10-CM | POA: Insufficient documentation

## 2020-06-17 LAB — ECHOCARDIOGRAM COMPLETE
Area-P 1/2: 3.39 cm2
P 1/2 time: 625 msec
S' Lateral: 3.9 cm

## 2020-06-21 ENCOUNTER — Telehealth: Payer: Self-pay | Admitting: Cardiovascular Disease

## 2020-06-21 NOTE — Telephone Encounter (Signed)
Pt called in returning Jason Pitts call about his recent  echo results  Best number [phone]

## 2020-06-22 NOTE — Telephone Encounter (Signed)
Lindell Spar, RN  06/22/2020  9:35 AM EDT Back to Top    Left message with wife to return call   Lindell Spar, RN  06/21/2020  3:06 PM EDT     Left message to call back on [phone] (Mobile) *Preferred*   Chrystie Nose, MD  06/17/2020  5:33 PM EDT     LVEF improved to 40-45%. Ascending aorta dilated to 4.7 cm (root of 4.3 cm) -  root measured 4.3 cm in 2018. Will need CT angiogram of the aorta in 6 months to reassess aortic aneurysm.   Dr Rexene Edison

## 2020-07-06 ENCOUNTER — Other Ambulatory Visit: Payer: Self-pay | Admitting: *Deleted

## 2020-07-06 DIAGNOSIS — I712 Thoracic aortic aneurysm, without rupture, unspecified: Secondary | ICD-10-CM

## 2020-07-06 NOTE — Telephone Encounter (Signed)
Patient called w/results of echo. CT angiogram of chest/aorta ordered for 6 months

## 2020-07-28 ENCOUNTER — Other Ambulatory Visit (HOSPITAL_COMMUNITY): Payer: Self-pay | Admitting: Family Medicine

## 2020-07-28 ENCOUNTER — Other Ambulatory Visit: Payer: Self-pay | Admitting: Family Medicine

## 2020-07-28 DIAGNOSIS — I10 Essential (primary) hypertension: Secondary | ICD-10-CM

## 2020-07-28 MED FILL — hydrALAZINE HCL 50 MG TABS: 50 | 15 days supply | Qty: 90 | Fill #0

## 2020-08-17 ENCOUNTER — Ambulatory Visit: Payer: Medicaid Other | Admitting: Family Medicine

## 2020-08-20 DEATH — deceased

## 2020-08-22 ENCOUNTER — Telehealth: Payer: Self-pay | Admitting: Family Medicine

## 2020-08-22 NOTE — Telephone Encounter (Signed)
Patients wife called to inform Dr. Obie Dredge that patient passed away 09-04-2020.

## 2020-08-22 NOTE — Telephone Encounter (Signed)
Returned his wife's call.  She reports that patient passed in his sleep on 12/31 and [place] they are from.

## 2020-08-24 ENCOUNTER — Telehealth: Payer: Self-pay | Admitting: Family Medicine

## 2020-08-24 ENCOUNTER — Ambulatory Visit: Payer: Medicaid Other | Admitting: Family Medicine

## 2020-08-24 NOTE — Telephone Encounter (Signed)
Death Certificate was dropped off by [facility] and has been placed in PCP's box for completion. Please use blue or black ink. Please return to [facility] once completed.

## 2020-08-26 NOTE — Telephone Encounter (Signed)
Completed and given to [facility].

## 2020-08-26 NOTE — Telephone Encounter (Signed)
Called [facility] to pick-up death certificate;  [facility] Practice Administrator notified

## 2020-08-30 ENCOUNTER — Telehealth: Payer: Self-pay | Admitting: Family Medicine

## 2020-08-30 NOTE — Telephone Encounter (Signed)
Received call from funeral home director, Joyce Gross, who notes that Death Certificate now needs to be filled out through online [employer] system.    Called her and advised her to assign to Doralee Albino, MD, who agreed to fil out death certificate.  Cause of death filled out as follows: 1. Nonischemic cardiomyopathy 2. Heart failure with reduced ejection fraction 3. Poorly controlled Hypertension  Patient was a tobacco user  This was a natural death and not referred to Medical Examiner.  Appreciate Dr. Cyndia Skeeters assistance and Death Certificate will be assigned to him.  Luis Abed, D.O.  PGY-3 Family Medicine  08/30/2020 1:48 PM

## 2020-08-31 NOTE — Telephone Encounter (Signed)
Hi Shanda Bumps, can you mark this patient as deceased?  Thanks!

## 2020-08-31 NOTE — Telephone Encounter (Signed)
Done. Jessica Fleeger, CMA  

## 2021-01-31 IMAGING — DX DG CHEST 1V PORT
1 series · 1 of 1 positions shown · non-contrast
Comparison: 07/22/2018

CLINICAL DATA: Dyspnea found unresponsive

EXAM:
PORTABLE CHEST 1 VIEW

[chest]
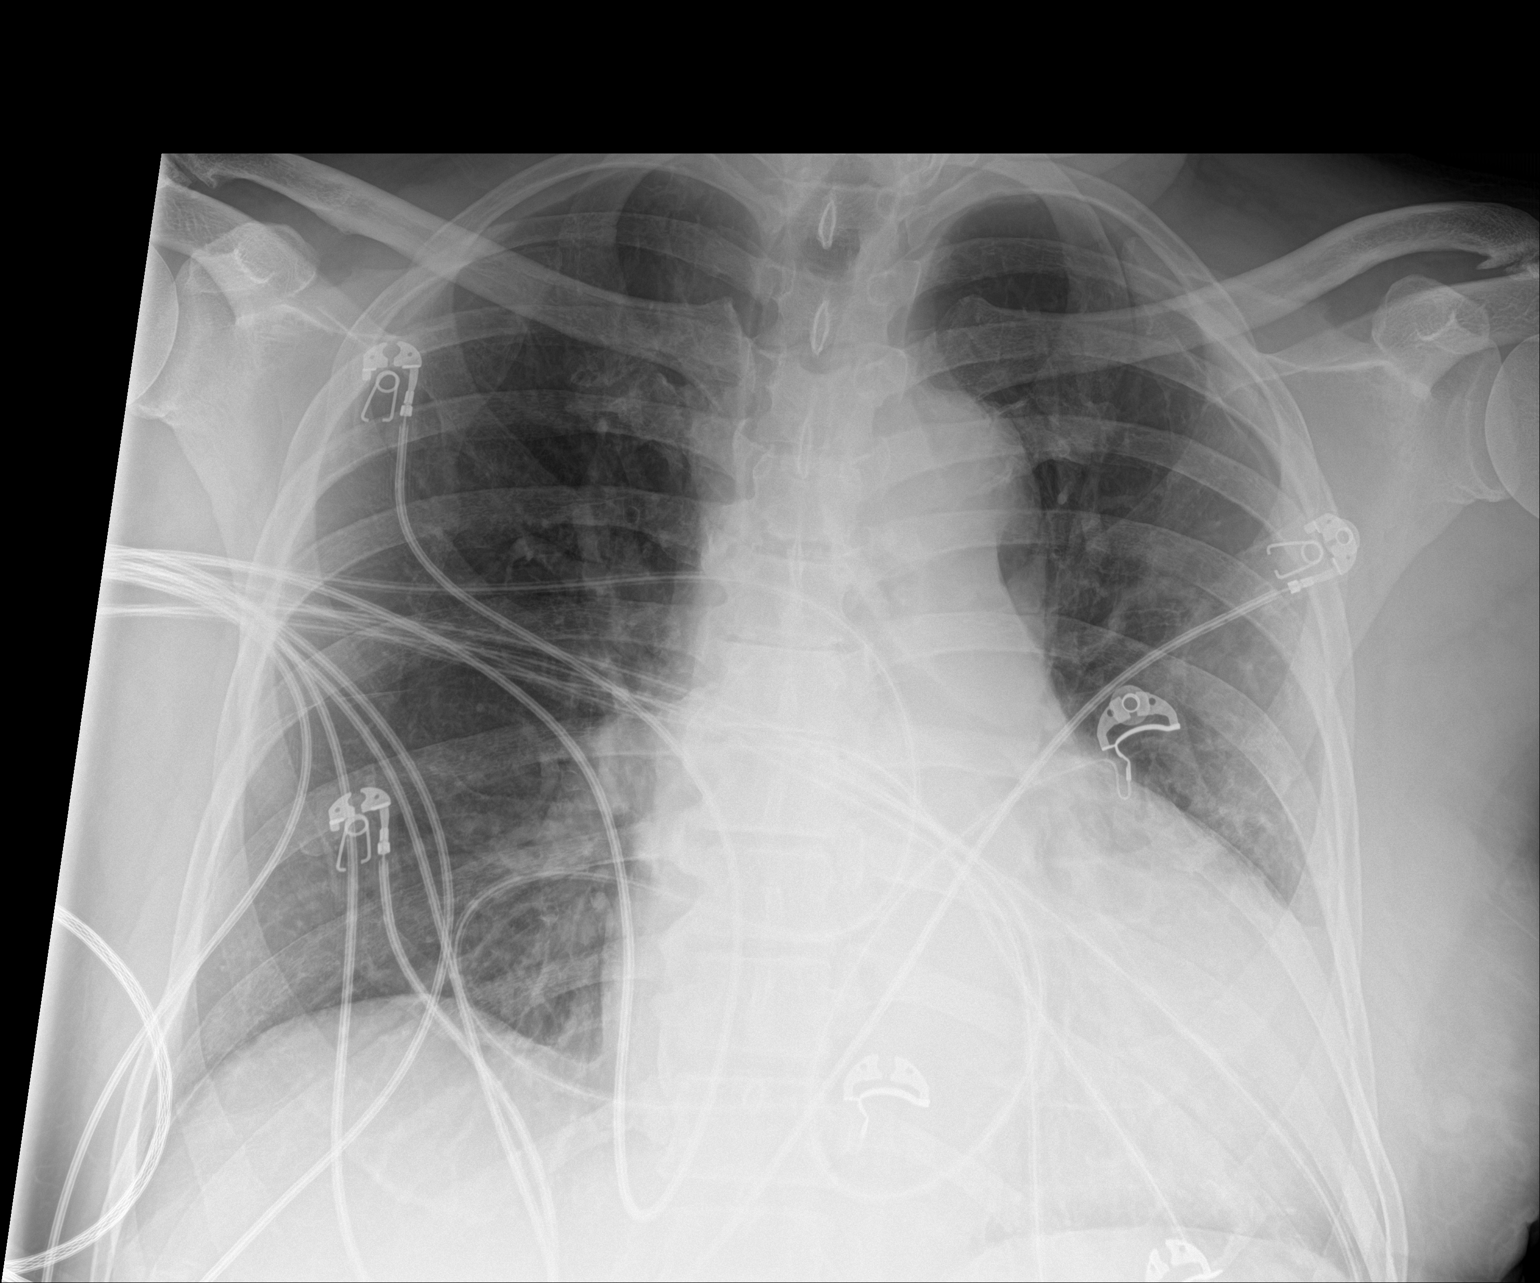

[1 of 1 positions shown; findings below may reference images not displayed]

FINDINGS: Mild cardiomegaly. No focal opacity or pleural effusion. Aortic
atherosclerosis. No pneumothorax.
IMPRESSION: No active disease. Mild cardiomegaly.
